# Patient Record
Sex: Female | Born: 1990 | Race: Black or African American | Hispanic: No | Marital: Single | Smoking: Never smoker
Health system: Southern US, Community
[De-identification: ages and names within clinical notes are randomized; demographics above are authoritative.]

## PROBLEM LIST (undated history)

## (undated) DIAGNOSIS — Z789 Other specified health status: Secondary | ICD-10-CM

## (undated) DIAGNOSIS — Z8489 Family history of other specified conditions: Secondary | ICD-10-CM

## (undated) DIAGNOSIS — N883 Incompetence of cervix uteri: Secondary | ICD-10-CM

## (undated) HISTORY — PX: DILATION AND CURETTAGE OF UTERUS: SHX78

## (undated) HISTORY — DX: Family history of other specified conditions: Z84.89

## (undated) HISTORY — DX: Other specified health status: Z78.9

---

## 2015-06-09 HISTORY — PX: CERVICAL CERCLAGE: SHX1329

## 2019-06-17 ENCOUNTER — Other Ambulatory Visit: Payer: Self-pay

## 2019-06-17 DIAGNOSIS — Z20822 Contact with and (suspected) exposure to covid-19: Secondary | ICD-10-CM | POA: Diagnosis not present

## 2019-06-18 LAB — NOVEL CORONAVIRUS, NAA: SARS-CoV-2, NAA: NOT DETECTED

## 2019-07-03 DIAGNOSIS — L680 Hirsutism: Secondary | ICD-10-CM | POA: Diagnosis not present

## 2019-07-03 DIAGNOSIS — Z0000 Encounter for general adult medical examination without abnormal findings: Secondary | ICD-10-CM | POA: Diagnosis not present

## 2019-07-10 DIAGNOSIS — Z01419 Encounter for gynecological examination (general) (routine) without abnormal findings: Secondary | ICD-10-CM | POA: Diagnosis not present

## 2019-10-05 DIAGNOSIS — R109 Unspecified abdominal pain: Secondary | ICD-10-CM | POA: Diagnosis not present

## 2019-12-31 ENCOUNTER — Encounter (HOSPITAL_COMMUNITY): Payer: Self-pay | Admitting: Obstetrics and Gynecology

## 2019-12-31 ENCOUNTER — Emergency Department (HOSPITAL_COMMUNITY)
Admission: EM | Admit: 2019-12-31 | Discharge: 2019-12-31 | Disposition: A | Discharge status: Home / Self Care | Payer: Federal, State, Local not specified - PPO | Attending: Emergency Medicine | Admitting: Emergency Medicine

## 2019-12-31 ENCOUNTER — Other Ambulatory Visit: Payer: Self-pay

## 2019-12-31 DIAGNOSIS — Z3A00 Weeks of gestation of pregnancy not specified: Secondary | ICD-10-CM | POA: Diagnosis not present

## 2019-12-31 DIAGNOSIS — O26891 Other specified pregnancy related conditions, first trimester: Secondary | ICD-10-CM | POA: Insufficient documentation

## 2019-12-31 DIAGNOSIS — Z5321 Procedure and treatment not carried out due to patient leaving prior to being seen by health care provider: Secondary | ICD-10-CM | POA: Diagnosis not present

## 2019-12-31 DIAGNOSIS — R109 Unspecified abdominal pain: Secondary | ICD-10-CM | POA: Insufficient documentation

## 2019-12-31 DIAGNOSIS — O208 Other hemorrhage in early pregnancy: Secondary | ICD-10-CM | POA: Insufficient documentation

## 2019-12-31 HISTORY — DX: Incompetence of cervix uteri: N88.3

## 2019-12-31 LAB — COMPREHENSIVE METABOLIC PANEL
ALT: 15 U/L (ref 0–44)
AST: 29 U/L (ref 15–41)
Albumin: 4.2 g/dL (ref 3.5–5.0)
Alkaline Phosphatase: 59 U/L (ref 38–126)
Anion gap: 11 (ref 5–15)
BUN: 7 mg/dL (ref 6–20)
CO2: 22 mmol/L (ref 22–32)
Calcium: 9.1 mg/dL (ref 8.9–10.3)
Chloride: 102 mmol/L (ref 98–111)
Creatinine, Ser: 0.71 mg/dL (ref 0.44–1.00)
GFR calc Af Amer: >60 mL/min (ref >60)
GFR calc non Af Amer: >60 mL/min (ref >60)
Glucose, Bld: 113 mg/dL — ABNORMAL HIGH (ref 70–99)
Potassium: 3.2 mmol/L — ABNORMAL LOW (ref 3.5–5.1)
Sodium: 135 mmol/L (ref 135–145)
Total Bilirubin: 0.4 mg/dL (ref 0.3–1.2)
Total Protein: 8.2 g/dL — ABNORMAL HIGH (ref 6.5–8.1)

## 2019-12-31 LAB — CBC
HCT: 39.9 % (ref 36.0–46.0)
Hemoglobin: 12.7 g/dL (ref 12.0–15.0)
MCH: 29.3 pg (ref 26.0–34.0)
MCHC: 31.8 g/dL (ref 30.0–36.0)
MCV: 92.1 fL (ref 80.0–100.0)
Platelets: 198 K/uL (ref 150–400)
RBC: 4.33 MIL/uL (ref 3.87–5.11)
RDW: 13.3 % (ref 11.5–15.5)
WBC: 5.6 K/uL (ref 4.0–10.5)
nRBC: 0 % (ref 0.0–0.2)

## 2019-12-31 LAB — HCG, QUANTITATIVE, PREGNANCY: hCG, Beta Chain, Quant, S: 18635 m[IU]/mL — ABNORMAL HIGH

## 2019-12-31 LAB — LIPASE, BLOOD: Lipase: 23 U/L (ref 11–51)

## 2019-12-31 MED ORDER — SODIUM CHLORIDE 0.9% FLUSH: 3.0000 mL | Freq: Once | INTRAVENOUS | Status: DC

## 2019-12-31 NOTE — ED Triage Notes (Signed)
Pt reports to the ER for spotting during pregnancy. Patient reports she had a positive urine preg test at home. Reports cramping and spotting

## 2020-01-03 DIAGNOSIS — Z124 Encounter for screening for malignant neoplasm of cervix: Secondary | ICD-10-CM | POA: Diagnosis not present

## 2020-01-03 DIAGNOSIS — N898 Other specified noninflammatory disorders of vagina: Secondary | ICD-10-CM | POA: Diagnosis not present

## 2020-01-04 DIAGNOSIS — Z3400 Encounter for supervision of normal first pregnancy, unspecified trimester: Secondary | ICD-10-CM | POA: Diagnosis not present

## 2020-01-04 DIAGNOSIS — O209 Hemorrhage in early pregnancy, unspecified: Secondary | ICD-10-CM | POA: Diagnosis not present

## 2020-01-04 DIAGNOSIS — O09291 Supervision of pregnancy with other poor reproductive or obstetric history, first trimester: Secondary | ICD-10-CM | POA: Diagnosis not present

## 2020-01-17 DIAGNOSIS — O210 Mild hyperemesis gravidarum: Secondary | ICD-10-CM | POA: Diagnosis not present

## 2020-01-25 DIAGNOSIS — O09291 Supervision of pregnancy with other poor reproductive or obstetric history, first trimester: Secondary | ICD-10-CM | POA: Diagnosis not present

## 2020-01-25 DIAGNOSIS — O23599 Infection of other part of genital tract in pregnancy, unspecified trimester: Secondary | ICD-10-CM | POA: Diagnosis not present

## 2020-01-25 DIAGNOSIS — Z9889 Other specified postprocedural states: Secondary | ICD-10-CM | POA: Diagnosis not present

## 2020-01-25 DIAGNOSIS — O09891 Supervision of other high risk pregnancies, first trimester: Secondary | ICD-10-CM | POA: Diagnosis not present

## 2020-02-02 ENCOUNTER — Encounter: Payer: Self-pay | Admitting: *Deleted

## 2020-02-06 ENCOUNTER — Ambulatory Visit: Payer: Federal, State, Local not specified - PPO

## 2020-02-06 ENCOUNTER — Other Ambulatory Visit: Payer: Self-pay

## 2020-02-06 ENCOUNTER — Ambulatory Visit: Payer: Federal, State, Local not specified - PPO | Admitting: *Deleted

## 2020-02-06 ENCOUNTER — Other Ambulatory Visit: Payer: Self-pay | Admitting: *Deleted

## 2020-02-06 ENCOUNTER — Encounter: Payer: Self-pay | Admitting: *Deleted

## 2020-02-06 ENCOUNTER — Ambulatory Visit
Payer: Federal, State, Local not specified - PPO | Attending: Obstetrics and Gynecology | Admitting: Obstetrics and Gynecology

## 2020-02-06 VITALS — BP 122/86 | HR 89

## 2020-02-06 DIAGNOSIS — Z3A11 11 weeks gestation of pregnancy: Secondary | ICD-10-CM | POA: Insufficient documentation

## 2020-02-06 DIAGNOSIS — O0990 Supervision of high risk pregnancy, unspecified, unspecified trimester: Secondary | ICD-10-CM

## 2020-02-06 DIAGNOSIS — Z3401 Encounter for supervision of normal first pregnancy, first trimester: Secondary | ICD-10-CM

## 2020-02-06 DIAGNOSIS — O3431 Maternal care for cervical incompetence, first trimester: Secondary | ICD-10-CM | POA: Insufficient documentation

## 2020-02-06 DIAGNOSIS — Z8759 Personal history of other complications of pregnancy, childbirth and the puerperium: Secondary | ICD-10-CM | POA: Diagnosis not present

## 2020-02-06 DIAGNOSIS — O09291 Supervision of pregnancy with other poor reproductive or obstetric history, first trimester: Secondary | ICD-10-CM

## 2020-02-06 DIAGNOSIS — N883 Incompetence of cervix uteri: Secondary | ICD-10-CM

## 2020-02-06 DIAGNOSIS — O0991 Supervision of high risk pregnancy, unspecified, first trimester: Secondary | ICD-10-CM | POA: Diagnosis not present

## 2020-02-06 NOTE — Progress Notes (Signed)
MFM Consult Note  Patient name: April Burke DOB: [birth year]  April Burke is a gravida 4 para 0 who is currently at 11 weeks and 1 day with an [facility] of August 26, 2020.  She was seen in consultation today due to a prior poor obstetrical history with probable cervical incompetence with two midtrimester losses.  The patient's pregnancy history is as follows:  2014- termination of pregnancy  2016- loss at 16 to 17 weeks due to advanced cervical dilatation  2017-loss at 20 weeks due to preterm labor/cervical incompetence.  The patient reports that a cervical cerclage was placed at around 16 weeks when her cervix was found to be 1 cm dilated.  She reports that she was also treated with vaginal progesterone.  However, at around 20 weeks, the membranes prolapsed beyond the cerclage and she subsequently delivered.  She denies any prior cervical surgeries.  These pregnancies all occurred at a hospital in [place], [place].  She denies any significant past medical history.  Her surgical history includes a D&C for termination of pregnancy, cerclage placement, and removal of stone in her salivary gland.  The patient reports that she had an ultrasound performed in your office about a month ago showing a viable singleton intrauterine pregnancy with a crown-rump length that measured consistent with her dates.  She has not had any screening tests for fetal aneuploidy drawn in her current pregnancy.  The patient was advised that although it is difficult to ascertain if she truly has cervical incompetence, based on her history of two prior losses possibly due to early cervical dilatation, a history indicated cervical cerclage is recommended in her current pregnancy.  The cerclage should be placed at between 13 to 14 weeks, prior to the onset of cervical shortening, so that a cerclage encompassing a good segment of the cervix can be placed.  The patient should probably have another ultrasound performed in your  office prior to the placement of the cerclage to verify that a viable intrauterine pregnancy is present.  She will have a cell free DNA test through the lab [facility] drawn in our office later today to screen for fetal aneuploidy.  The results from this test should be available sometime next week.  Our genetic counselor will notify the patient regarding the results.  Due to her history of multiple preterm births, I would also recommend that she be started on weekly injections of 17 alpha hydroxyprogesterone caproate (17-P, Makena) starting at around 16 weeks and continued weekly until 36 weeks.  The patient was advised that the weekly progesterone injections have been shown to decrease the risk of a subsequent preterm birth in women with a history of prior preterm births.  An ultrasound for assessment of her cervical length was scheduled in our office at around 16 weeks.  We will also schedule a detailed fetal anatomy scan for her at around 18 to 19 weeks.  She should then continue to be followed with serial growth ultrasounds throughout her pregnancy.  The patient and her partner were advised that hopefully with the prophylactic cerclage and the weekly progesterone injections, we will help her achieve a successful pregnancy outcome. They understand that despite all medical intervention, some women with cervical incompetence may still experience another loss.  They were reassured that I have managed the pregnancies of many women with a similar pregnancy history who had successful pregnancy outcomes with a prophylactic cerclage and weekly progesterone injections.  They were advised that should she develop preterm labor symptoms  once she reaches viability (23 weeks or greater), that preterm labor can be managed to help her achieve a successful pregnancy outcome.  At the end of the consultation, the patient and her partner stated that all their questions had been answered to their complete satisfaction.  We look  forward to following her with you throughout her pregnancy.    Thank you for referring this very nice patient for Maternal-Fetal Medicine consultation.  Recommendations:  Cell free DNA test drawn today  Prophylactic cerclage to be placed at 13 to 14 weeks   Start weekly 17-P/Makena injections at 16 weeks and continued weekly until 36 weeks  Cervical length measurement at 16 weeks and fetal anatomy scan scheduled in our office

## 2020-02-08 ENCOUNTER — Other Ambulatory Visit: Payer: Self-pay | Admitting: Obstetrics and Gynecology

## 2020-02-13 ENCOUNTER — Telehealth: Payer: Self-pay | Admitting: Genetic Counselor

## 2020-02-13 NOTE — Telephone Encounter (Addendum)
Received a call back from Ms. Pree. I informed her that unfortunately, her NIPS sample failed due to a laboratory processing issue. Per [facility], this failure was not due to a problem with specimen collection or a specimen integrity issue. I reassured Ms. Youngers that there is nothing she did to cause the sample to fail.   I informed her that a redraw is possible if she is still interested in pursuing NIPS. She does not think that she wants a redraw at this time. However, she requested more time to decide if she wants to try again or not. She informed me that she has her cerclage appointment next week on 9/13. We reviewed that if she had a sample redrawn today, it is possible that results may be back before then; however, this cannot be guaranteed, as NIPS results usually take ~1 week to be returned. She was also informed that NIPS still remains an option after she has her cerclage placed.  I asked Ms. Potteiger to contact me once she has decided if she would like to have a redraw for NIPS. If she does, I can give her instructions for sample collection. If she does not, I will contact [facility] and have them cancel the order so that she does not get a bill. Ms. Lafuente was agreeable with this plan.  Buelah Manis, MS, [facility] Genetic Counselor

## 2020-02-13 NOTE — Telephone Encounter (Signed)
LVM for April Burke informing her that I had an update regarding her testing, as her [facility] noninvasive prenatal screening (NIPS) failed. Requested a call back to discuss this update in detail and will review redraw options with her at that time.  Buelah Manis, MS, [facility] Genetic Counselor

## 2020-02-15 DIAGNOSIS — O09299 Supervision of pregnancy with other poor reproductive or obstetric history, unspecified trimester: Secondary | ICD-10-CM | POA: Diagnosis not present

## 2020-02-19 NOTE — H&P (Addendum)
GYN Admission H&P  April Burke is an 29 y.o. G4P0120 at [redacted]w[redacted]d by 6 week [place] who presents for history indicated cerclage placement.  Her pregnancy is complicated by history of cervical insufficiency, obesity (BMI 33), and rubella non-immune.  Her obstetric history is as documented below.  After consultation with Maternal Fetal Medicine (Dr. Annamaria Boots), it was recommended that based on her history that she have history indicated cerclage placement between 13-[redacted] weeks gestation.     Obstetric History: G1:  2010, early EAB (D&C), no complications G2:  6213:  16-17 week SAB, occurred in [place] G3:  2017, 20 week PTD (failed rescue cerclage that was placed at 16 weeks), occurred in [place]:  Current    Past Medical History:  Diagnosis Date  . Incompetent cervix   . Medical history non-contributory     Past Surgical History:  Procedure Laterality Date  . CERVICAL CERCLAGE  2017   Failed rescue cerclage  . DILATION AND CURETTAGE OF UTERUS      Family History  Problem Relation Age of Onset  . Hypertension Father     Social History:  reports that she has never smoked. She has never used smokeless tobacco. She reports previous alcohol use. She reports previous drug use. Drug: Marijuana.  Allergies: No Known Allergies  Medications:  PNV  Review of Systems  Constitutional: Negative for chills, fever, malaise/fatigue and weight loss.  Eyes: Negative for blurred vision.  Respiratory: Negative for cough.   Cardiovascular: Negative for chest pain, palpitations and leg swelling.  Gastrointestinal: Negative for abdominal pain, blood in stool, constipation and heartburn.  Genitourinary: Negative for dysuria, flank pain, frequency, hematuria and urgency.  Musculoskeletal: Negative.  Negative for myalgias and neck pain.  Skin: Negative for itching and rash.  Neurological: Negative for dizziness and headaches.  Psychiatric/Behavioral: Negative.     Physical Exam: Last menstrual period 11/13/2019.   Gen:  NAD, pleasant and cooperative Cardio:  RRR Lungs:  CTAB Abd:  Soft, gravid, non-tender throughout Ext:  No bilateral LE edema Pelvic:  Cervix visually closed, no blood in vaginal vault  Wet prep (02/15/20):  Few clue cells, no yeast, no trichomonas GC/CT (02/15/20):  Negative  No results found for this or any previous visit (from the past 48 hour(s)).  No results found.  Assessment/Plan: April Burke is a 29 y.o. G4P0120 at [redacted]w[redacted]d by 6 week [place] who presents for history indicated cerclage placement.  Her pregnancy is complicated by history of cervical insufficiency, obesity (BMI 33), and rubella non-immune.  - Admit to L&D OR for outpatient procedure - Labs:  T&S - IVF:  Per anesthesia - Antibiotics:  None indicated - DVT prophylaxis:  SCDs - Fetal heart tones before and after procedure - Genetic screening:  Previous lab error with cfDNA, will obtain after procedure - Treatment sent for BV prior to procedure - Post-op visit 2 weeks after procedure - Anticipate DC home same day  Consents (Cerclage and blood products):  Patient counseled on the risks, benefits, alternatives to cerclage.  Risks include, but at not limited to, bleeding, requiring blood transfusion, infection, damage to organs and tissues, risk of preterm labor or premature rupture of membranes with subsequent previable, periviable, or preterm delivery and infant death, risk of failure of procedure to achieve prolongation of pregnancy, deep vein thrombosis and/or pulmonary embolism, complications the course of which cannot be predicted or prevented, and death.  She understands that there is a risk of bleeding which includes the risk of requiring a blood  transfusion which holds a 1:2 million risk of HIV, a 1:2 million risk of Hepatitis C, and a 1:200,000 risk of Hepatitis B as well as the risk of allergic type reactions called transfusion reactions.  Drema Dallas 02/19/2020

## 2020-02-20 ENCOUNTER — Encounter (HOSPITAL_BASED_OUTPATIENT_CLINIC_OR_DEPARTMENT_OTHER): Payer: Self-pay | Admitting: Obstetrics and Gynecology

## 2020-02-20 ENCOUNTER — Other Ambulatory Visit: Payer: Self-pay

## 2020-02-20 DIAGNOSIS — O09299 Supervision of pregnancy with other poor reproductive or obstetric history, unspecified trimester: Secondary | ICD-10-CM | POA: Diagnosis not present

## 2020-02-20 DIAGNOSIS — Z3A13 13 weeks gestation of pregnancy: Secondary | ICD-10-CM | POA: Diagnosis not present

## 2020-02-21 NOTE — Progress Notes (Signed)
MD order for Preoperative and Postoperative Fetal Heart Tones.Called OB Rapid Response Nurse and spoke with Larene Beach. Provided patient information, arrival and surgery time information for arrangement of OB RR nurse to come to [facility] for FHT assessment. Provide phone number for [facility] to contact if questions.

## 2020-02-22 ENCOUNTER — Telehealth (HOSPITAL_COMMUNITY): Payer: Self-pay | Admitting: *Deleted

## 2020-02-22 ENCOUNTER — Other Ambulatory Visit (HOSPITAL_COMMUNITY)
Admission: RE | Admit: 2020-02-22 | Discharge: 2020-02-22 | Disposition: A | Discharge status: Home / Self Care | Payer: Federal, State, Local not specified - PPO | Source: Ambulatory Visit | Attending: Obstetrics and Gynecology | Admitting: Obstetrics and Gynecology

## 2020-02-22 ENCOUNTER — Encounter (HOSPITAL_COMMUNITY): Payer: Self-pay

## 2020-02-22 DIAGNOSIS — Z20822 Contact with and (suspected) exposure to covid-19: Secondary | ICD-10-CM | POA: Diagnosis not present

## 2020-02-22 DIAGNOSIS — Z01812 Encounter for preprocedural laboratory examination: Secondary | ICD-10-CM | POA: Diagnosis not present

## 2020-02-22 LAB — SARS CORONAVIRUS 2 (TAT 6-24 HRS): SARS Coronavirus 2: NEGATIVE

## 2020-02-22 NOTE — Telephone Encounter (Signed)
Pt aware of cerclage procedure location change to [facility] and [place].  Arrival of 0530  NPO.  No medications to take.  Going to get her covid test today as planned by [employer].   Questions answered.

## 2020-02-25 NOTE — Anesthesia Preprocedure Evaluation (Addendum)
Anesthesia Evaluation  Patient identified by MRN, date of birth, ID band Patient awake    Reviewed: Allergy & Precautions, NPO status , Patient's Chart, lab work & pertinent test results  Airway Mallampati: II  TM Distance: >3 FB Neck ROM: Full    Dental  (+) Teeth Intact   Pulmonary neg pulmonary ROS,    Pulmonary exam normal breath sounds clear to auscultation       Cardiovascular Exercise Tolerance: Good negative cardio ROS Normal cardiovascular exam Rhythm:Regular Rate:Normal     Neuro/Psych negative neurological ROS  negative psych ROS   GI/Hepatic negative GI ROS, Neg liver ROS,   Endo/Other  negative endocrine ROSObesity   Renal/GU negative Renal ROS     Musculoskeletal negative musculoskeletal ROS (+)   Abdominal   Peds  Hematology  (+) Blood dyscrasia, anemia , PLT 171K   Anesthesia Other Findings   Reproductive/Obstetrics (+) Pregnancy (14 wks) history of cervical incompetence in pregnancy                           Anesthesia Physical Anesthesia Plan  ASA: II  Anesthesia Plan: Spinal   Post-op Pain Management:    Induction:   PONV Risk Score and Plan: 2 and Treatment may vary due to age or medical condition  Airway Management Planned: Natural Airway  Additional Equipment:   Intra-op Plan:   Post-operative Plan:   Informed Consent: I have reviewed the patients History and Physical, chart, labs and discussed the procedure including the risks, benefits and alternatives for the proposed anesthesia with the patient or authorized representative who has indicated his/her understanding and acceptance.       Plan Discussed with: CRNA  Anesthesia Plan Comments:        Anesthesia Quick Evaluation

## 2020-02-26 ENCOUNTER — Observation Stay (HOSPITAL_COMMUNITY)
Admission: RE | Admit: 2020-02-26 | Discharge: 2020-02-26 | Disposition: A | Discharge status: Home / Self Care | Payer: Federal, State, Local not specified - PPO | Attending: Obstetrics and Gynecology | Admitting: Obstetrics and Gynecology

## 2020-02-26 ENCOUNTER — Encounter (HOSPITAL_COMMUNITY): Payer: Self-pay | Admitting: Obstetrics and Gynecology

## 2020-02-26 ENCOUNTER — Other Ambulatory Visit: Payer: Self-pay

## 2020-02-26 ENCOUNTER — Inpatient Hospital Stay (HOSPITAL_COMMUNITY): Payer: Federal, State, Local not specified - PPO | Admitting: Anesthesiology

## 2020-02-26 ENCOUNTER — Encounter (HOSPITAL_COMMUNITY)
Admission: RE | Disposition: A | Discharge status: Home / Self Care | Payer: Self-pay | Source: Home / Self Care | Attending: Obstetrics and Gynecology

## 2020-02-26 DIAGNOSIS — O3431 Maternal care for cervical incompetence, first trimester: Secondary | ICD-10-CM | POA: Diagnosis not present

## 2020-02-26 DIAGNOSIS — Z3A14 14 weeks gestation of pregnancy: Secondary | ICD-10-CM | POA: Diagnosis not present

## 2020-02-26 DIAGNOSIS — O3432 Maternal care for cervical incompetence, second trimester: Secondary | ICD-10-CM

## 2020-02-26 DIAGNOSIS — O99011 Anemia complicating pregnancy, first trimester: Secondary | ICD-10-CM | POA: Diagnosis not present

## 2020-02-26 DIAGNOSIS — N883 Incompetence of cervix uteri: Secondary | ICD-10-CM | POA: Diagnosis present

## 2020-02-26 DIAGNOSIS — D649 Anemia, unspecified: Secondary | ICD-10-CM | POA: Diagnosis not present

## 2020-02-26 HISTORY — PX: CERVICAL CERCLAGE: SHX1329

## 2020-02-26 LAB — CBC
HCT: 34.3 % — ABNORMAL LOW (ref 36.0–46.0)
Hemoglobin: 11.4 g/dL — ABNORMAL LOW (ref 12.0–15.0)
MCH: 29.3 pg (ref 26.0–34.0)
MCHC: 33.2 g/dL (ref 30.0–36.0)
MCV: 88.2 fL (ref 80.0–100.0)
Platelets: 171 10*3/uL (ref 150–400)
RBC: 3.89 MIL/uL (ref 3.87–5.11)
RDW: 13.3 % (ref 11.5–15.5)
WBC: 6 10*3/uL (ref 4.0–10.5)
nRBC: 0 % (ref 0.0–0.2)

## 2020-02-26 LAB — TYPE AND SCREEN
ABO/RH(D): B POS
Antibody Screen: NEGATIVE

## 2020-02-26 SURGERY — CERCLAGE, CERVIX, VAGINAL APPROACH
Anesthesia: Spinal

## 2020-02-26 MED ORDER — CHLOROPROCAINE HCL 1 % IJ SOLN
INTRAMUSCULAR | Status: DC | PRN
  Administered 2020-02-26: 50 mg

## 2020-02-26 MED ORDER — PHENYLEPHRINE HCL (PRESSORS) 10 MG/ML IV SOLN
INTRAVENOUS | Status: DC | PRN
  Administered 2020-02-26: 80 ug via INTRAVENOUS

## 2020-02-26 MED ORDER — POVIDONE-IODINE 10 % EX SWAB
2.0000 "application " | Freq: Once | CUTANEOUS | Status: AC
  Administered 2020-02-26: 2 via TOPICAL

## 2020-02-26 MED ORDER — ONDANSETRON HCL 4 MG/2ML IJ SOLN
INTRAMUSCULAR | Status: DC | PRN
  Administered 2020-02-26: 4 mg via INTRAVENOUS

## 2020-02-26 MED ORDER — LACTATED RINGERS IV SOLN: INTRAVENOUS | Status: DC

## 2020-02-26 MED ORDER — ACETAMINOPHEN 500 MG PO TABS
1000.0000 mg | ORAL_TABLET | Freq: Once | ORAL | Status: AC
  Administered 2020-02-26: 1000 mg via ORAL

## 2020-02-26 MED ORDER — ONDANSETRON HCL 4 MG/2ML IJ SOLN
INTRAMUSCULAR | Status: AC
  Filled 2020-02-26: qty 2

## 2020-02-26 MED ORDER — SOD CITRATE-CITRIC ACID 500-334 MG/5ML PO SOLN
ORAL | Status: AC
  Filled 2020-02-26: qty 30

## 2020-02-26 MED ORDER — LACTATED RINGERS IV SOLN: INTRAVENOUS | Status: DC | PRN

## 2020-02-26 MED ORDER — FENTANYL CITRATE (PF) 100 MCG/2ML IJ SOLN: 25.0000 ug | INTRAMUSCULAR | Status: DC | PRN

## 2020-02-26 MED ORDER — PHENYLEPHRINE 40 MCG/ML (10ML) SYRINGE FOR IV PUSH (FOR BLOOD PRESSURE SUPPORT)
PREFILLED_SYRINGE | INTRAVENOUS | Status: AC
  Filled 2020-02-26: qty 10

## 2020-02-26 MED ORDER — PROMETHAZINE HCL 25 MG/ML IJ SOLN: 6.2500 mg | INTRAMUSCULAR | Status: DC | PRN

## 2020-02-26 MED ORDER — SOD CITRATE-CITRIC ACID 500-334 MG/5ML PO SOLN
30.0000 mL | Freq: Once | ORAL | Status: AC
  Administered 2020-02-26: 30 mL via ORAL

## 2020-02-26 MED ORDER — CHLOROPROCAINE HCL 50 MG/5ML IT SOLN
INTRATHECAL | Status: AC
  Filled 2020-02-26: qty 5

## 2020-02-26 MED ORDER — ACETAMINOPHEN 500 MG PO TABS
ORAL_TABLET | ORAL | Status: AC
  Filled 2020-02-26: qty 2

## 2020-02-26 SURGICAL SUPPLY — 11 items
CLOTH BEACON ORANGE TIMEOUT ST (SAFETY) ×2 IMPLANT
GLOVE ECLIPSE 6.5 STRL STRAW (GLOVE) ×4 IMPLANT
GOWN STRL REUS W/ TWL LRG LVL3 (GOWN DISPOSABLE) IMPLANT
GOWN STRL REUS W/TWL LRG LVL3 (GOWN DISPOSABLE) ×4
NDL HYPO 25X5/8 SAFETYGLIDE (NEEDLE) IMPLANT
NEEDLE HYPO 25X5/8 SAFETYGLIDE (NEEDLE) ×3 IMPLANT
NS IRRIG 1000ML POUR BTL (IV SOLUTION) ×2 IMPLANT
PAD OB MATERNITY 4.3X12.25 (PERSONAL CARE ITEMS) ×2 IMPLANT
SUT PROLENE 1 CT 1 30 (SUTURE) ×2 IMPLANT
TOWEL OR 17X24 6PK STRL BLUE (TOWEL DISPOSABLE) ×2 IMPLANT
TRAY FOLEY SLVR 16FR LF STAT (SET/KITS/TRAYS/PACK) ×2 IMPLANT

## 2020-02-26 NOTE — Discharge Instructions (Signed)
Cervical Cerclage, Care After This sheet gives you information about how to care for yourself after your procedure. Your health care provider may also give you more specific instructions. If you have problems or questions, contact your health care provider. What can I expect after the procedure? After your procedure, it is common to have:  Cramping in your abdomen.  Mucus discharge from your vagina. This may last for several days.  Painful urination (dysuria).  Spotting, or small drops of blood coming from your vagina. Follow these instructions at home:  Medicines  Take over-the-counter and prescription medicines only as told by your health care provider.  Ask your health care provider if the medicine prescribed to you requires you to avoid driving or using heavy machinery. General instructions  If you are told to go on bed rest, follow instructions from your health care provider. You may need to be on bed rest for up to 3 days.  Keep track of your vaginal discharge and watch for any changes. If you notice changes, tell your health care provider.  Avoid physical activities and exercise until your health care provider approves. Ask your health care provider what activities are safe for you.  Do not douche or have sex until your health care provider says it is okay to do so.  Keep all follow-up visits, including prenatal visits, as told by your health care provider. This is important. ? Prenatal visits are all the care that you receive before the birth of your baby. ? You may also need an ultrasound.  You may be asked to have weekly visits to have your cervix checked. Contact a health care provider if you:  Have abnormal discharge from your vagina, such as clots.  Have a bad-smelling discharge from your vagina.  Develop a rash on your skin. This may look like redness and swelling.  Become light-headed or feel like you are going to faint.  Have abdominal pain that does not  get better with medicine.  Have nausea or vomiting that does not go away. Get help right away if you:  Have vaginal bleeding that is heavier or more frequent than spotting.  Are leaking fluid or your water breaks.  Have a fever or chills.  Faint.  Have uterine contractions. These may feel like: ? A back ache. ? Lower abdominal pain. ? Mild cramps, similar to menstrual cramps. ? Tightening or pressure in your abdomen.  Think that your baby is not moving as much as usual, or you cannot feel your baby move.  Have chest pain.  Have shortness of breath. Summary  After the procedure, it is common to have cramping, vaginal discharge, painful urination, and small drops of blood coming from your vagina.  If you are told to go on bed rest, follow instructions from your health care provider. You may need to be on bed rest for up to 3 days.  Keep track of your vaginal discharge and watch for any changes. If you notice changes, tell your health care provider.  Contact a health care provider if you have abnormal vaginal discharge, become light-headed, or have pain that cannot be controlled with medicines.  Get help right away if you have heavy vaginal bleeding, your water breaks, or you have uterine contractions. Also, get help right away if your baby is not moving as much as usual, or you have chest pain or shortness of breath. This information is not intended to replace advice given to you by your health care provider.  Make sure you discuss any questions you have with your health care provider. Document Revised: 03/21/2019 Document Reviewed: 01/18/2019 Elsevier Patient Education  2020 [employer].

## 2020-02-26 NOTE — H&P (Signed)
See updated H&P for no interval changes.  Drema Dallas, DO

## 2020-02-26 NOTE — Transfer of Care (Signed)
Immediate Anesthesia Transfer of Care Note  Patient: April Burke  Procedure(s) Performed: CERCLAGE CERVICAL (N/A )  Patient Location: PACU  Anesthesia Type:Spinal  Level of Consciousness: awake, alert  and oriented  Airway & Oxygen Therapy: Patient Spontanous Breathing  Post-op Assessment: Report given to RN and Post -op Vital signs reviewed and stable  Post vital signs: Reviewed and stable  Last Vitals:  Vitals Value Taken Time  BP 115/52 02/26/20 0808  Temp    Pulse 85 02/26/20 0811  Resp 18 02/26/20 0811  SpO2 99 % 02/26/20 0811  Vitals shown include unvalidated device data.  Last Pain:  Vitals:   02/26/20 0620  TempSrc: Oral         Complications: No complications documented.

## 2020-02-26 NOTE — Op Note (Signed)
Preop Diagnosis:  History of cervical insufficiency  Postop Diagnosis:  History of cervical insufficiency  Procedure: History indicated McDonald Cerclage  Anesthesia:  Regional (spinal)  Attending:  Dr. Drema Dallas  Assistant:  Dr. Christophe Louis  Findings:  Normal-appearing cervix, no cervical dilation present, white discharge in vaginal vault.  Pathology:  None  Fluids:  1000cc crystalloid  UOP:  100cc   EBL:  3cc  Complications:  None  Indications:  29 year old G37P0120 with history of 16-17 week SAB and 20 week PTD after failed rescue cerclage that was placed at 16 weeks.  Maternal Fetal Medicine recommended history indicated cerclage placement between 13-14 weeks.  Procedure:Then patient was taken to the operating room after the risks, benefits and alternatives discussed with the patient and consent signed and witnessed.  Fetal heart tones were 159 prior to procedure.  The patient was given a spinal per anesthesia and placed in the dorsal lithotomy position.  The patient was prepped and draped in the usual sterile fashion.  A cervical cerclage stitch was placed using 1-0 prolene in a pursestring fashion with knot at 12 o'clock.  Hemostasis was noted.  Sponge, lap and needle count was correct and the patient was transferred to the recovery room in good condition.   Disposition:  PACU  Drema Dallas, DO

## 2020-02-26 NOTE — Discharge Summary (Addendum)
     GYN Discharge Summary  Date of Service 02/26/2020     Patient Name: April Burke DOB: [birth year] MRN: [id number]  Date of admission: 02/26/2020  Date of discharge: 02/26/2020  Admitting diagnosis: Cervical incompetence [N88.3] Intrauterine pregnancy: [redacted]w[redacted]d     Secondary diagnosis:  Active Problems:   Cervical incompetence  Procedure:  McDonald Cerclage Additional problems: Obesity (BMI 33) and Rubella non-immune   Discharge diagnosis: History of cervical insufficiency                                              Complications: None  Hospital Course:  Patient was admitted on 02/26/20 for a history indicated McDonald Cerclage placement.  Her post-operative course was unremarkable.  She was discharged home on 02/26/20 in stable condition.  A work-note was provided for the dates 9/18-9/23 to include pre-operative COVID testing/quarantine and post-operative recovery.  Physical exam  Vitals:   02/20/20 1611 02/26/20 0620  BP:  120/80  Resp:  17  Temp:  97.9 F (36.6 C)  TempSrc:  Oral  SpO2:  99%  Weight: 98 kg 98 kg  Height: 5\' 7"  (1.702 m) 5\' 7"  (1.702 m)   Labs: Lab Results  Component Value Date   WBC 6.0 02/26/2020   HGB 11.4 (L) 02/26/2020   HCT 34.3 (L) 02/26/2020   MCV 88.2 02/26/2020   PLT 171 02/26/2020   CMP Latest Ref Rng & Units 12/31/2019  Glucose 70 - 99 mg/dL 113(H)  BUN 6 - 20 mg/dL 7  Creatinine 0.44 - 1.00 mg/dL 0.71  Sodium 135 - 145 mmol/L 135  Potassium 3.5 - 5.1 mmol/L 3.2(L)  Chloride 98 - 111 mmol/L 102  CO2 22 - 32 mmol/L 22  Calcium 8.9 - 10.3 mg/dL 9.1  Total Protein 6.5 - 8.1 g/dL 8.2(H)  Total Bilirubin 0.3 - 1.2 mg/dL 0.4  Alkaline Phos 38 - 126 U/L 59  AST 15 - 41 U/L 29  ALT 0 - 44 U/L 15     After visit meds:  Allergies as of 02/26/2020   No Known Allergies     Medication List    TAKE these medications   aspirin EC 81 MG tablet Take 81 mg by mouth daily. Swallow whole.   metroNIDAZOLE 250 MG tablet Commonly known as:  FLAGYL Take 250 mg by mouth 2 (two) times daily.   PRENATAL GUMMIES/DHA & FA PO Take by mouth.        Discharge home in stable condition Discharge instruction: per After Visit Summary  Activity: Advance as tolerated. Pelvic rest for 6 weeks.  Diet: routine diet Post-operative Appointment:2 weeks (03/11/2020)  Future Appointments: Future Appointments  Date Time Provider [place]  03/12/2020  7:30 AM [facility] NURSE [facility]  03/12/2020  7:45 AM [facility] US4 [facility]  03/26/2020  8:45 AM [facility] NURSE [facility]  03/26/2020  9:00 AM [facility] US1 [facility] [place]   Follow up Visit:  Follow-up Information    Drema Dallas, DO Follow up in 2 week(s).   Specialty: Obstetrics and Gynecology Contact information: [address] [place] [address] [phone]                02/26/2020 Drema Dallas, DO

## 2020-02-26 NOTE — Anesthesia Procedure Notes (Signed)
Spinal  Patient location during procedure: OR Start time: 02/26/2020 7:24 AM End time: 02/26/2020 7:27 AM Staffing Performed: anesthesiologist  Anesthesiologist: Catalina Gravel, MD Preanesthetic Checklist Completed: patient identified, IV checked, risks and benefits discussed, surgical consent, monitors and equipment checked, pre-op evaluation and timeout performed Spinal Block Patient position: sitting Prep: DuraPrep and site prepped and draped Patient monitoring: continuous pulse ox and blood pressure Approach: midline Location: L3-4 Injection technique: single-shot Needle Needle type: Pencan  Needle gauge: 24 G Assessment Sensory level: T8 Additional Notes Functioning IV was confirmed and monitors were applied. Sterile prep and drape, including hand hygiene, mask and sterile gloves were used. The patient was positioned and the spine was prepped. The skin was anesthetized with lidocaine.  Free flow of clear CSF was obtained prior to injecting local anesthetic into the CSF.  The spinal needle aspirated freely following injection.  The needle was carefully withdrawn.  The patient tolerated the procedure well. Consent was obtained prior to procedure with all questions answered and concerns addressed. Risks including but not limited to bleeding, infection, nerve damage, paralysis, failed block, inadequate analgesia, allergic reaction, high spinal, itching and headache were discussed and the patient wished to proceed.   Hoy Morn, MD

## 2020-02-26 NOTE — Anesthesia Postprocedure Evaluation (Signed)
Anesthesia Post Note  Patient: Scientist, product/process development  Procedure(s) Performed: CERCLAGE CERVICAL (N/A )     Patient location during evaluation: PACU Anesthesia Type: Spinal Level of consciousness: oriented, awake and alert and awake Pain management: pain level controlled Vital Signs Assessment: post-procedure vital signs reviewed and stable Respiratory status: spontaneous breathing, respiratory function stable and patient connected to nasal cannula oxygen Cardiovascular status: blood pressure returned to baseline and stable Postop Assessment: no headache, no backache, no apparent nausea or vomiting, patient able to bend at knees and spinal receding Anesthetic complications: no   No complications documented.  Last Vitals:  Vitals:   02/26/20 0902 02/26/20 0915  BP: 105/69 112/69  Pulse: 78 71  Resp: 16 17  Temp:    SpO2: 100% 100%    Last Pain:  Vitals:   02/26/20 0815  TempSrc: Oral   Pain Goal:    LLE Motor Response: No movement due to regional block (02/26/20 0900)   RLE Motor Response: Purposeful movement (02/26/20 0900) RLE Sensation: Tingling (02/26/20 0900)     Epidural/Spinal Function Cutaneous sensation: Tingles (02/26/20 0900), Patient able to flex knees: No (02/26/20 0900), Patient able to lift hips off bed: No (02/26/20 0900), Back pain beyond tenderness at insertion site: No (02/26/20 0900), Progressively worsening motor and/or sensory loss: No (02/26/20 0900), Bowel and/or bladder incontinence post epidural: No (02/26/20 0900)  Catalina Gravel

## 2020-02-29 ENCOUNTER — Encounter (HOSPITAL_COMMUNITY): Payer: Self-pay | Admitting: Obstetrics and Gynecology

## 2020-03-11 DIAGNOSIS — Z3482 Encounter for supervision of other normal pregnancy, second trimester: Secondary | ICD-10-CM | POA: Diagnosis not present

## 2020-03-12 ENCOUNTER — Ambulatory Visit: Payer: Federal, State, Local not specified - PPO

## 2020-03-15 ENCOUNTER — Ambulatory Visit: Payer: Federal, State, Local not specified - PPO | Admitting: *Deleted

## 2020-03-15 ENCOUNTER — Other Ambulatory Visit: Payer: Self-pay | Admitting: *Deleted

## 2020-03-15 ENCOUNTER — Ambulatory Visit: Payer: Federal, State, Local not specified - PPO | Attending: Obstetrics

## 2020-03-15 ENCOUNTER — Other Ambulatory Visit: Payer: Self-pay

## 2020-03-15 VITALS — BP 121/80 | HR 77

## 2020-03-15 DIAGNOSIS — O3430 Maternal care for cervical incompetence, unspecified trimester: Secondary | ICD-10-CM

## 2020-03-15 DIAGNOSIS — O09292 Supervision of pregnancy with other poor reproductive or obstetric history, second trimester: Secondary | ICD-10-CM | POA: Diagnosis not present

## 2020-03-15 DIAGNOSIS — O321XX0 Maternal care for breech presentation, not applicable or unspecified: Secondary | ICD-10-CM

## 2020-03-15 DIAGNOSIS — N883 Incompetence of cervix uteri: Secondary | ICD-10-CM

## 2020-03-15 DIAGNOSIS — Z3A16 16 weeks gestation of pregnancy: Secondary | ICD-10-CM | POA: Diagnosis not present

## 2020-03-15 DIAGNOSIS — Z3686 Encounter for antenatal screening for cervical length: Secondary | ICD-10-CM

## 2020-03-15 DIAGNOSIS — O3432 Maternal care for cervical incompetence, second trimester: Secondary | ICD-10-CM

## 2020-03-19 ENCOUNTER — Other Ambulatory Visit: Payer: Self-pay | Admitting: Obstetrics and Gynecology

## 2020-03-19 DIAGNOSIS — O3430 Maternal care for cervical incompetence, unspecified trimester: Secondary | ICD-10-CM

## 2020-03-20 ENCOUNTER — Ambulatory Visit: Payer: Federal, State, Local not specified - PPO

## 2020-03-20 ENCOUNTER — Other Ambulatory Visit: Payer: Self-pay

## 2020-03-20 ENCOUNTER — Ambulatory Visit: Payer: Federal, State, Local not specified - PPO | Attending: Obstetrics and Gynecology

## 2020-03-20 ENCOUNTER — Other Ambulatory Visit: Payer: Self-pay | Admitting: Obstetrics and Gynecology

## 2020-03-20 ENCOUNTER — Other Ambulatory Visit: Payer: Self-pay | Admitting: *Deleted

## 2020-03-20 DIAGNOSIS — Z3686 Encounter for antenatal screening for cervical length: Secondary | ICD-10-CM

## 2020-03-20 DIAGNOSIS — O09292 Supervision of pregnancy with other poor reproductive or obstetric history, second trimester: Secondary | ICD-10-CM | POA: Diagnosis not present

## 2020-03-20 DIAGNOSIS — O3432 Maternal care for cervical incompetence, second trimester: Secondary | ICD-10-CM

## 2020-03-20 DIAGNOSIS — O3430 Maternal care for cervical incompetence, unspecified trimester: Secondary | ICD-10-CM

## 2020-03-20 DIAGNOSIS — O322XX0 Maternal care for transverse and oblique lie, not applicable or unspecified: Secondary | ICD-10-CM

## 2020-03-20 DIAGNOSIS — Z3A17 17 weeks gestation of pregnancy: Secondary | ICD-10-CM

## 2020-03-20 DIAGNOSIS — O09299 Supervision of pregnancy with other poor reproductive or obstetric history, unspecified trimester: Secondary | ICD-10-CM

## 2020-03-20 DIAGNOSIS — Z9889 Other specified postprocedural states: Secondary | ICD-10-CM

## 2020-03-26 ENCOUNTER — Other Ambulatory Visit: Payer: Self-pay | Admitting: Obstetrics and Gynecology

## 2020-03-26 ENCOUNTER — Other Ambulatory Visit: Payer: Self-pay

## 2020-03-26 ENCOUNTER — Other Ambulatory Visit: Payer: Self-pay | Admitting: *Deleted

## 2020-03-26 ENCOUNTER — Ambulatory Visit: Payer: Federal, State, Local not specified - PPO | Attending: Obstetrics and Gynecology

## 2020-03-26 ENCOUNTER — Encounter: Payer: Self-pay | Admitting: *Deleted

## 2020-03-26 ENCOUNTER — Ambulatory Visit: Payer: Federal, State, Local not specified - PPO | Admitting: *Deleted

## 2020-03-26 VITALS — BP 125/74 | HR 94

## 2020-03-26 DIAGNOSIS — E669 Obesity, unspecified: Secondary | ICD-10-CM | POA: Diagnosis not present

## 2020-03-26 DIAGNOSIS — O3432 Maternal care for cervical incompetence, second trimester: Secondary | ICD-10-CM | POA: Diagnosis not present

## 2020-03-26 DIAGNOSIS — Z362 Encounter for other antenatal screening follow-up: Secondary | ICD-10-CM

## 2020-03-26 DIAGNOSIS — Z3A18 18 weeks gestation of pregnancy: Secondary | ICD-10-CM

## 2020-03-26 DIAGNOSIS — O09299 Supervision of pregnancy with other poor reproductive or obstetric history, unspecified trimester: Secondary | ICD-10-CM | POA: Insufficient documentation

## 2020-03-26 DIAGNOSIS — O3430 Maternal care for cervical incompetence, unspecified trimester: Secondary | ICD-10-CM

## 2020-03-26 DIAGNOSIS — Z9889 Other specified postprocedural states: Secondary | ICD-10-CM | POA: Diagnosis not present

## 2020-03-26 DIAGNOSIS — O09292 Supervision of pregnancy with other poor reproductive or obstetric history, second trimester: Secondary | ICD-10-CM

## 2020-03-26 DIAGNOSIS — O99212 Obesity complicating pregnancy, second trimester: Secondary | ICD-10-CM | POA: Diagnosis not present

## 2020-03-26 DIAGNOSIS — Z3686 Encounter for antenatal screening for cervical length: Secondary | ICD-10-CM

## 2020-03-27 ENCOUNTER — Telehealth (HOSPITAL_COMMUNITY): Payer: Self-pay | Admitting: *Deleted

## 2020-03-27 ENCOUNTER — Other Ambulatory Visit: Payer: Self-pay | Admitting: Obstetrics and Gynecology

## 2020-03-27 ENCOUNTER — Encounter (HOSPITAL_COMMUNITY): Payer: Self-pay

## 2020-03-27 ENCOUNTER — Encounter (HOSPITAL_COMMUNITY): Payer: Self-pay | Admitting: *Deleted

## 2020-03-27 NOTE — H&P (Deleted)
  The note originally documented on this encounter has been moved the the encounter in which it belongs.  

## 2020-03-27 NOTE — H&P (Signed)
April Burke is a 29 y.o. female at 18 wks and 3 days presenting for revision of cervical cerclage. She has a h/o cervical incompetence with previous pregnancies. She failed cerclage with her last pregnancy.  Patient has been followed by [facility]. She was evaluated by Dr. Jasmine Awe on 03/26/2020 at which time. Cervical funnelling was noted past the current cerclage.  Prenatal has been provided by Dr. Drema Dallas with [facility].   . OB History    Gravida  5   Para  0   Term      Preterm  0   AB  4   Living  0     SAB  2   TAB  1   Ectopic      Multiple      Live Births           Obstetric Comments  Failed rescue cerclage       Past Medical History:  Diagnosis Date  . Family history of adverse reaction to anesthesia    sister allergic swollen tongue  . Incompetent cervix   . Medical history non-contributory    Past Surgical History:  Procedure Laterality Date  . CERVICAL CERCLAGE  2017   Failed rescue cerclage  . CERVICAL CERCLAGE N/A 02/26/2020   Procedure: CERCLAGE CERVICAL;  Surgeon: Drema Dallas, DO;  Location: [facility] LD ORS;  Service: Gynecology;  Laterality: N/A;  . DILATION AND CURETTAGE OF UTERUS     Family History: family history includes Hypertension in her father. Social History:  reports that she has never smoked. She has never used smokeless tobacco. She reports previous alcohol use. She reports previous drug use. Drug: Marijuana.     Maternal Diabetes: No Genetic Screening: Declined Maternal Ultrasounds/Referrals: Normal Fetal Ultrasounds or other Referrals:  None Maternal Substance Abuse:  No Significant Maternal Medications:  None Significant Maternal Lab Results:  None Other Comments:  None  Review of Systems  Constitutional: Negative.   HENT: Negative.   Eyes: Negative.   Respiratory: Negative.   Cardiovascular: Negative.   Gastrointestinal: Negative.   Endocrine: Negative.   Genitourinary: Negative.    Musculoskeletal: Negative.   Skin: Negative.   Allergic/Immunologic: Negative.   Neurological: Negative.   Hematological: Negative.   Psychiatric/Behavioral: Negative.    History   Last menstrual period 11/13/2019. Maternal Exam:  Introitus: Normal vulva.   Physical Exam Vitals reviewed.  Constitutional:      Appearance: Normal appearance.  HENT:     Head: Normocephalic.     Nose: Nose normal.     Mouth/Throat:     Mouth: Mucous membranes are moist.  Eyes:     Pupils: Pupils are equal, round, and reactive to light.  Cardiovascular:     Rate and Rhythm: Normal rate and regular rhythm.     Pulses: Normal pulses.     Heart sounds: Normal heart sounds.  Pulmonary:     Effort: Pulmonary effort is normal.     Breath sounds: Normal breath sounds.  Abdominal:     Tenderness: There is no abdominal tenderness.  Genitourinary:    General: Normal vulva.  Musculoskeletal:        General: Normal range of motion.     Cervical back: Normal range of motion and neck supple.  Skin:    General: Skin is warm.  Neurological:     General: No focal deficit present.     Mental Status: She is alert and oriented to person, place, and time.  Psychiatric:        Mood and Affect: Mood normal.        Behavior: Behavior normal.     Prenatal labs: ABO, Rh: --/--/B POS (09/20 3779) Antibody: NEG (09/20 3968) Rubella:  Nonimmune  RPR:   Nonreactive HBsAg:   Negative  HIV:   Negative  GBS:     Assessment/Plan: 18 wks and 3 days with cervical insufficiency. Failing initial cerclage placement  Per Dr. Tama High pt to have revision of cervical cerclage. He has counselled patient regarding risk benefits and alternatives.  Christophe Louis 03/27/2020, 1:01 PM

## 2020-03-27 NOTE — Telephone Encounter (Signed)
Preadmission screen Coming in at 0815 for covid test and cerclage.  NPO after midnight.  Understands plan of care and questions answered.

## 2020-03-28 ENCOUNTER — Other Ambulatory Visit: Payer: Self-pay

## 2020-03-28 ENCOUNTER — Observation Stay (HOSPITAL_COMMUNITY): Payer: Federal, State, Local not specified - PPO | Admitting: Certified Registered Nurse Anesthetist

## 2020-03-28 ENCOUNTER — Observation Stay (HOSPITAL_COMMUNITY)
Admission: RE | Admit: 2020-03-28 | Discharge: 2020-03-28 | Disposition: A | Discharge status: Home / Self Care | Payer: Federal, State, Local not specified - PPO | Attending: Obstetrics and Gynecology | Admitting: Obstetrics and Gynecology

## 2020-03-28 ENCOUNTER — Encounter (HOSPITAL_COMMUNITY): Payer: Self-pay | Admitting: Obstetrics and Gynecology

## 2020-03-28 ENCOUNTER — Encounter (HOSPITAL_COMMUNITY)
Admission: RE | Disposition: A | Discharge status: Home / Self Care | Payer: Self-pay | Source: Home / Self Care | Attending: Obstetrics and Gynecology

## 2020-03-28 ENCOUNTER — Encounter: Payer: Self-pay | Admitting: Obstetrics and Gynecology

## 2020-03-28 DIAGNOSIS — O3432 Maternal care for cervical incompetence, second trimester: Secondary | ICD-10-CM | POA: Diagnosis not present

## 2020-03-28 DIAGNOSIS — Z3A18 18 weeks gestation of pregnancy: Secondary | ICD-10-CM | POA: Insufficient documentation

## 2020-03-28 DIAGNOSIS — O09292 Supervision of pregnancy with other poor reproductive or obstetric history, second trimester: Secondary | ICD-10-CM | POA: Diagnosis not present

## 2020-03-28 DIAGNOSIS — N883 Incompetence of cervix uteri: Secondary | ICD-10-CM | POA: Diagnosis present

## 2020-03-28 DIAGNOSIS — Z20822 Contact with and (suspected) exposure to covid-19: Secondary | ICD-10-CM | POA: Diagnosis not present

## 2020-03-28 DIAGNOSIS — O26872 Cervical shortening, second trimester: Secondary | ICD-10-CM | POA: Diagnosis not present

## 2020-03-28 HISTORY — PX: CERVICAL CERCLAGE: SHX1329

## 2020-03-28 LAB — TYPE AND SCREEN
ABO/RH(D): B POS
Antibody Screen: NEGATIVE

## 2020-03-28 LAB — RESPIRATORY PANEL BY RT PCR (FLU A&B, COVID)
Influenza A by PCR: NEGATIVE
Influenza B by PCR: NEGATIVE
SARS Coronavirus 2 by RT PCR: NEGATIVE

## 2020-03-28 LAB — CBC
HCT: 34.5 % — ABNORMAL LOW (ref 36.0–46.0)
Hemoglobin: 11.3 g/dL — ABNORMAL LOW (ref 12.0–15.0)
MCH: 29.1 pg (ref 26.0–34.0)
MCHC: 32.8 g/dL (ref 30.0–36.0)
MCV: 88.9 fL (ref 80.0–100.0)
Platelets: 186 10*3/uL (ref 150–400)
RBC: 3.88 MIL/uL (ref 3.87–5.11)
RDW: 14 % (ref 11.5–15.5)
WBC: 6.1 10*3/uL (ref 4.0–10.5)
nRBC: 0 % (ref 0.0–0.2)

## 2020-03-28 SURGERY — CERCLAGE, CERVIX, VAGINAL APPROACH
Anesthesia: Spinal | Site: Vagina | Wound class: Clean Contaminated

## 2020-03-28 MED ORDER — ACETAMINOPHEN 500 MG PO TABS
ORAL_TABLET | ORAL | Status: AC
  Filled 2020-03-28: qty 2

## 2020-03-28 MED ORDER — ACETAMINOPHEN 500 MG PO TABS: 1000.0000 mg | ORAL_TABLET | ORAL | Status: DC

## 2020-03-28 MED ORDER — SODIUM CHLORIDE 0.9 % IR SOLN
Status: DC | PRN
  Administered 2020-03-28: 400 mL via INTRAVESICAL

## 2020-03-28 MED ORDER — ONDANSETRON HCL 4 MG/2ML IJ SOLN
INTRAMUSCULAR | Status: DC | PRN
  Administered 2020-03-28: 4 mg via INTRAVENOUS

## 2020-03-28 MED ORDER — PHENYLEPHRINE HCL (PRESSORS) 10 MG/ML IV SOLN
INTRAVENOUS | Status: DC | PRN
  Administered 2020-03-28 (×2): 80 ug via INTRAVENOUS

## 2020-03-28 MED ORDER — POVIDONE-IODINE 10 % EX SWAB
2.0000 "application " | Freq: Once | CUTANEOUS | Status: AC
  Administered 2020-03-28: 2 via TOPICAL

## 2020-03-28 MED ORDER — CHLOROPROCAINE HCL 50 MG/5ML IT SOLN
INTRATHECAL | Status: AC
  Filled 2020-03-28: qty 5

## 2020-03-28 MED ORDER — CHLOROPROCAINE HCL 50 MG/5ML IT SOLN
INTRATHECAL | Status: DC | PRN
  Administered 2020-03-28: 50 mg via INTRATHECAL

## 2020-03-28 MED ORDER — CHLOROPROCAINE HCL 1 % IJ SOLN: INTRAMUSCULAR | Status: DC | PRN

## 2020-03-28 MED ORDER — PROGESTERONE 200 MG PO CAPS: 200.0000 mg | ORAL_CAPSULE | Freq: Every day | ORAL | 4 refills | Status: DC

## 2020-03-28 MED ORDER — LACTATED RINGERS IV SOLN: INTRAVENOUS | Status: DC | PRN

## 2020-03-28 MED ORDER — LACTATED RINGERS IV SOLN: INTRAVENOUS | Status: DC

## 2020-03-28 MED ORDER — FENTANYL CITRATE (PF) 100 MCG/2ML IJ SOLN
INTRAMUSCULAR | Status: AC
  Filled 2020-03-28: qty 2

## 2020-03-28 MED ORDER — ONDANSETRON HCL 4 MG/2ML IJ SOLN
INTRAMUSCULAR | Status: AC
  Filled 2020-03-28: qty 2

## 2020-03-28 SURGICAL SUPPLY — 17 items
CANISTER SUCT 3000ML PPV (MISCELLANEOUS) ×2 IMPLANT
GLOVE BIOGEL M 6.5 STRL (GLOVE) ×4 IMPLANT
GLOVE BIOGEL PI IND STRL 6.5 (GLOVE) ×1 IMPLANT
GLOVE BIOGEL PI IND STRL 7.0 (GLOVE) ×1 IMPLANT
GLOVE BIOGEL PI INDICATOR 6.5 (GLOVE) ×1
GLOVE BIOGEL PI INDICATOR 7.0 (GLOVE) ×1
GOWN STRL REUS W/TWL LRG LVL3 (GOWN DISPOSABLE) ×4 IMPLANT
PACK VAGINAL MINOR WOMEN LF (CUSTOM PROCEDURE TRAY) ×2 IMPLANT
PAD OB MATERNITY 4.3X12.25 (PERSONAL CARE ITEMS) ×2 IMPLANT
PAD PREP 24X48 CUFFED NSTRL (MISCELLANEOUS) ×2 IMPLANT
PENCIL BUTTON HOLSTER BLD 10FT (ELECTRODE) ×2 IMPLANT
SUT MERSILENE FIBER S 5 MO-4 1 (SUTURE) ×2 IMPLANT
SUT PROLENE 1 CT 1 30 (SUTURE) ×2 IMPLANT
TOWEL OR 17X24 6PK STRL BLUE (TOWEL DISPOSABLE) ×4 IMPLANT
TRAY FOLEY W/BAG SLVR 14FR (SET/KITS/TRAYS/PACK) ×2 IMPLANT
TUBING NON-CON 1/4 X 20 CONN (TUBING) IMPLANT
YANKAUER SUCT BULB TIP NO VENT (SUCTIONS) ×4 IMPLANT

## 2020-03-28 NOTE — Brief Op Note (Signed)
03/28/2020  1:04 PM  PATIENT:  Raynald Kemp  29 y.o. female  PRE-OPERATIVE DIAGNOSIS:  18-weeks' gestation with cervical insufficiency  POST-OPERATIVE DIAGNOSIS:  Same with Revision Cerclage  PROCEDURE:   REVISION CERCLAGE  SURGEON:  Surgeon(s) and Role:   Tama High, MD-Primary Christophe Louis MD-Assisting   PHYSICIAN ASSISTANT:   ASSISTANTS: Christophe Louis, MD  ANESTHESIA:    Spinal  EBL:  20 milliliters  BLOOD ADMINISTERED:none  DRAINS: none   LOCAL MEDICATIONS USED:  NONE  SPECIMEN:  No Specimen  DISPOSITION OF SPECIMEN:  N/A  COUNTS:  YES  TOURNIQUET:  None  DICTATION: .Note written in EPIC  PLAN OF CARE: Discharge to home after PACU  PATIENT DISPOSITION:  PACU - hemodynamically stable.   Delay start of Pharmacological VTE agent (>24hrs) due to surgical blood loss or risk of bleeding: not applicable

## 2020-03-28 NOTE — Op Note (Signed)
Maternal-Fetal Medicine   Name: April Burke MRN: [id number] Procedure: Revision Cerclage   PRE-OPERATIVE DIAGNOSIS:  18-weeks' gestation with cervical insufficiency  POST-OPERATIVE DIAGNOSIS:  Same with Revision Cerclage   PROCEDURE:  Procedure(s): REVISION CERCLAGE CERVICAL   SURGEON:  Surgeon(s) and Role:  ** Tama High, MD - Primary   * Christophe Louis, MD - Assisting      ANESTHESIA:   spinal    I discussed procedure and complications in detail before obtaining consent in the Pre-op area. After informed consent, the patient was taken to the OR and after spinal anesthesia, the bladder was catheterized. The patient was prepped and draped in dorsal lithotomy position. A sterile weighted speculum was inserted.  The external os closed. The cervix Anteriorly, the cervix was about 2 cm long.   The cervix and vagina appeared healthy. Previous cerclage (proline) was seen. The anterior lip of the cervix was held with a ring forceps.  With help of Bovie, a small incision of about 2 cm was made at the cervicovesical junction and the bladder was gently pushed up. Vesicocervical space (bloodless) was clearly seen.   A Mersilene 5 tape stitch was placed circumferentially starting at 11 O'Clock position and around the cervix. Final stitch exited close to 1 O'Clock position.  All around, the stitch was placed as high as possible. Before placing the knot, 400 mL of saline was instilled Into the bladder by the nurse. This was to facilitate retraction of amniotic membranes into the uterine cavity. Rectal examination was performed and no inadvertent sutures were felt.  Five secure knots were placed anteriorly. The cerclage (new) is about 2 cm higher than the previous one. The external os was closed. Complete hemostasis was achieved. Counts were correct. Estimated blood loss: 20 milliliters.    I reassured the patient of successful procedure. I recommended that she continue taking vaginal progesterone  from tomorrow night. Patient left the operating room in stable condition. . -We made an appointment for [facility] to [facility] weeks to evaluate the cervix.  I recommended that she discontinue makena injections since her history is clearly consistent with cervical insufficiency.

## 2020-03-28 NOTE — Progress Notes (Signed)
Maternal-Fetal Medicine  April Burke, G5 P0 at 18w 3d gestation, is admitted for revision cerclage procedure. Obstetric history is significant for 2 mid-trimester pregnancy losses. In this pregnancy, she had prophylactic cerclage placed at 15-weeks' gestation. Patient was being followed [facility]. On ultrasound performed on 03/26/20, the residual-closed portion of the cervix measured 7 millimeters and the membranes had passed beyond the level of cerclage.  On sterile-speculum examination, the external os was closed and the cervix was about 1.5 cm long. Cerclage was seen (proline).  I had counseled the patient on revision cerclage and the patient opted to have revision cerclage.  P/E: Patient is comfortably lying in the bed; not in pain. Vitals stable Abd: Soft gravid uterus; no tenderness.  I explained the procedure and its complications including miscarriage, rupture of membranes, infection, bleeding, and injuries to bladder or bowel (rare). Patient agreed to undergo revision cerclage procedure. Informed consent was obtained.

## 2020-03-28 NOTE — Transfer of Care (Signed)
Immediate Anesthesia Transfer of Care Note  Patient: April Burke  Procedure(s) Performed: CERCLAGE CERVICAL (N/A Vagina )  Patient Location: PACU  Anesthesia Type:Spinal  Level of Consciousness: awake, alert  and oriented  Airway & Oxygen Therapy: Patient Spontanous Breathing  Post-op Assessment: Report given to RN and Post -op Vital signs reviewed and stable  Post vital signs: Reviewed and stable  Last Vitals:  Vitals Value Taken Time  BP 105/61 03/28/20 1300  Temp    Pulse 79 03/28/20 1305  Resp 19 03/28/20 1305  SpO2 99 % 03/28/20 1305  Vitals shown include unvalidated device data.  Last Pain:  Vitals:   03/28/20 1300  TempSrc: (P) Oral  PainSc: (P) 0-No pain      Patients Stated Pain Goal: 8 (16/10/96 0454)  Complications: No complications documented.

## 2020-03-28 NOTE — Anesthesia Preprocedure Evaluation (Signed)
Anesthesia Evaluation  Patient identified by MRN, date of birth, ID band Patient awake    Reviewed: Patient's Chart, lab work & pertinent test results  Airway Mallampati: II  TM Distance: >3 FB Neck ROM: Full    Dental  (+) Teeth Intact   Pulmonary neg pulmonary ROS,    Pulmonary exam normal        Cardiovascular negative cardio ROS   Rhythm:Regular Rate:Normal     Neuro/Psych negative neurological ROS  negative psych ROS   GI/Hepatic negative GI ROS, Neg liver ROS,   Endo/Other  negative endocrine ROS  Renal/GU negative Renal ROS  negative genitourinary   Musculoskeletal negative musculoskeletal ROS (+)   Abdominal (+)  Abdomen: soft. Bowel sounds: normal.  Peds  Hematology negative hematology ROS (+)   Anesthesia Other Findings   Reproductive/Obstetrics (+) Pregnancy                             Anesthesia Physical Anesthesia Plan  ASA: II  Anesthesia Plan: Spinal   Post-op Pain Management:    Induction:   PONV Risk Score and Plan: Ondansetron and Dexamethasone  Airway Management Planned: Nasal Cannula  Additional Equipment: None  Intra-op Plan:   Post-operative Plan:   Informed Consent: I have reviewed the patients History and Physical, chart, labs and discussed the procedure including the risks, benefits and alternatives for the proposed anesthesia with the patient or authorized representative who has indicated his/her understanding and acceptance.     Dental advisory given  Plan Discussed with: CRNA  Anesthesia Plan Comments: (Lab Results      Component                Value               Date                      WBC                      6.1                 03/28/2020                HGB                      11.3 (L)            03/28/2020                HCT                      34.5 (L)            03/28/2020                MCV                      88.9                 03/28/2020                PLT                      186                 03/28/2020          )  Anesthesia Quick Evaluation  

## 2020-03-28 NOTE — Anesthesia Postprocedure Evaluation (Signed)
Anesthesia Post Note  Patient: Scientist, product/process development  Procedure(s) Performed: CERCLAGE CERVICAL (N/A Vagina )     Patient location during evaluation: Mother Baby Anesthesia Type: Spinal Level of consciousness: oriented and awake and alert Pain management: pain level controlled Vital Signs Assessment: post-procedure vital signs reviewed and stable Respiratory status: spontaneous breathing and respiratory function stable Cardiovascular status: blood pressure returned to baseline and stable Postop Assessment: no headache, no backache, no apparent nausea or vomiting and able to ambulate Anesthetic complications: no   No complications documented.  Last Vitals:  Vitals:   03/28/20 1515 03/28/20 1530  BP: 124/70 123/70  Pulse: 72 81  Resp: 19 15  Temp:    SpO2: 100% 100%    Last Pain:  Vitals:   03/28/20 1350  TempSrc:   PainSc: 3                  Eddis Pingleton P Laithan Conchas

## 2020-03-28 NOTE — Anesthesia Procedure Notes (Signed)
Spinal  Patient location during procedure: OR Start time: 03/28/2020 11:55 AM End time: 03/28/2020 11:58 AM Staffing Performed: anesthesiologist  Anesthesiologist: Darral Dash, DO Preanesthetic Checklist Completed: patient identified, IV checked, site marked, risks and benefits discussed, surgical consent, monitors and equipment checked, pre-op evaluation and timeout performed Spinal Block Patient position: sitting Prep: DuraPrep Patient monitoring: heart rate, cardiac monitor, continuous pulse ox and blood pressure Approach: midline Location: L3-4 Injection technique: single-shot Needle Needle type: Pencan  Needle gauge: 24 G Needle length: 10 cm Assessment Sensory level: T6 Additional Notes Patient identified. Risks/Benefits/Options discussed with patient including but not limited to bleeding, infection, nerve damage, paralysis, failed block, incomplete pain control, headache, blood pressure changes, nausea, vomiting, reactions to medications, itching and postpartum back pain. Confirmed with bedside nurse the patient's most recent platelet count. Confirmed with patient that they are not currently taking any anticoagulation, have any bleeding history or any family history of bleeding disorders. Patient expressed understanding and wished to proceed. All questions were answered. Sterile technique was used throughout the entire procedure. Please see nursing notes for vital signs. Warning signs of high block given to the patient including shortness of breath, tingling/numbness in hands, complete motor block, or any concerning symptoms with instructions to call for help. Patient was given instructions on fall risk and not to get out of bed. All questions and concerns addressed with instructions to call with any issues or inadequate analgesia.

## 2020-03-29 ENCOUNTER — Encounter (HOSPITAL_COMMUNITY): Payer: Self-pay | Admitting: Obstetrics and Gynecology

## 2020-03-29 LAB — RPR: RPR Ser Ql: NONREACTIVE

## 2020-04-09 ENCOUNTER — Encounter: Payer: Self-pay | Admitting: *Deleted

## 2020-04-09 ENCOUNTER — Other Ambulatory Visit: Payer: Self-pay | Admitting: Obstetrics and Gynecology

## 2020-04-09 ENCOUNTER — Ambulatory Visit: Payer: Federal, State, Local not specified - PPO | Attending: Obstetrics and Gynecology

## 2020-04-09 ENCOUNTER — Other Ambulatory Visit: Payer: Self-pay

## 2020-04-09 ENCOUNTER — Ambulatory Visit: Payer: Federal, State, Local not specified - PPO | Admitting: *Deleted

## 2020-04-09 VITALS — BP 129/76 | HR 96

## 2020-04-09 DIAGNOSIS — O3432 Maternal care for cervical incompetence, second trimester: Secondary | ICD-10-CM

## 2020-04-09 DIAGNOSIS — O99212 Obesity complicating pregnancy, second trimester: Secondary | ICD-10-CM

## 2020-04-09 DIAGNOSIS — O09292 Supervision of pregnancy with other poor reproductive or obstetric history, second trimester: Secondary | ICD-10-CM

## 2020-04-09 DIAGNOSIS — Z3686 Encounter for antenatal screening for cervical length: Secondary | ICD-10-CM

## 2020-04-09 DIAGNOSIS — Z3A20 20 weeks gestation of pregnancy: Secondary | ICD-10-CM

## 2020-04-09 DIAGNOSIS — E669 Obesity, unspecified: Secondary | ICD-10-CM | POA: Diagnosis not present

## 2020-04-09 DIAGNOSIS — Z362 Encounter for other antenatal screening follow-up: Secondary | ICD-10-CM

## 2020-04-23 ENCOUNTER — Other Ambulatory Visit: Payer: Self-pay

## 2020-04-23 ENCOUNTER — Ambulatory Visit: Payer: Federal, State, Local not specified - PPO | Admitting: *Deleted

## 2020-04-23 ENCOUNTER — Ambulatory Visit: Payer: Federal, State, Local not specified - PPO | Attending: Obstetrics and Gynecology

## 2020-04-23 ENCOUNTER — Other Ambulatory Visit: Payer: Self-pay | Admitting: *Deleted

## 2020-04-23 VITALS — BP 106/59 | HR 130

## 2020-04-23 DIAGNOSIS — Z3686 Encounter for antenatal screening for cervical length: Secondary | ICD-10-CM

## 2020-04-23 DIAGNOSIS — Z362 Encounter for other antenatal screening follow-up: Secondary | ICD-10-CM

## 2020-04-23 DIAGNOSIS — O3432 Maternal care for cervical incompetence, second trimester: Secondary | ICD-10-CM | POA: Diagnosis not present

## 2020-04-23 DIAGNOSIS — E669 Obesity, unspecified: Secondary | ICD-10-CM

## 2020-04-23 DIAGNOSIS — Z3A22 22 weeks gestation of pregnancy: Secondary | ICD-10-CM

## 2020-04-23 DIAGNOSIS — O99212 Obesity complicating pregnancy, second trimester: Secondary | ICD-10-CM

## 2020-04-23 DIAGNOSIS — O09292 Supervision of pregnancy with other poor reproductive or obstetric history, second trimester: Secondary | ICD-10-CM

## 2020-04-23 NOTE — Progress Notes (Signed)
Pulse 110 by palpation

## 2020-05-14 DIAGNOSIS — R531 Weakness: Secondary | ICD-10-CM | POA: Diagnosis not present

## 2020-05-21 ENCOUNTER — Other Ambulatory Visit: Payer: Self-pay

## 2020-05-21 ENCOUNTER — Encounter: Payer: Self-pay | Admitting: *Deleted

## 2020-05-21 ENCOUNTER — Ambulatory Visit: Payer: Federal, State, Local not specified - PPO | Attending: Obstetrics and Gynecology

## 2020-05-21 ENCOUNTER — Ambulatory Visit: Payer: Federal, State, Local not specified - PPO | Admitting: *Deleted

## 2020-05-21 VITALS — BP 110/77 | HR 114

## 2020-05-21 DIAGNOSIS — O09292 Supervision of pregnancy with other poor reproductive or obstetric history, second trimester: Secondary | ICD-10-CM | POA: Diagnosis not present

## 2020-05-21 DIAGNOSIS — N883 Incompetence of cervix uteri: Secondary | ICD-10-CM | POA: Insufficient documentation

## 2020-05-21 DIAGNOSIS — O99212 Obesity complicating pregnancy, second trimester: Secondary | ICD-10-CM

## 2020-05-21 DIAGNOSIS — E669 Obesity, unspecified: Secondary | ICD-10-CM

## 2020-05-21 DIAGNOSIS — O3432 Maternal care for cervical incompetence, second trimester: Secondary | ICD-10-CM

## 2020-05-21 DIAGNOSIS — Z3A26 26 weeks gestation of pregnancy: Secondary | ICD-10-CM

## 2020-06-06 DIAGNOSIS — Z3492 Encounter for supervision of normal pregnancy, unspecified, second trimester: Secondary | ICD-10-CM | POA: Diagnosis not present

## 2020-06-21 DIAGNOSIS — Z3490 Encounter for supervision of normal pregnancy, unspecified, unspecified trimester: Secondary | ICD-10-CM | POA: Diagnosis not present

## 2020-06-21 DIAGNOSIS — O09299 Supervision of pregnancy with other poor reproductive or obstetric history, unspecified trimester: Secondary | ICD-10-CM | POA: Diagnosis not present

## 2020-06-21 DIAGNOSIS — Z23 Encounter for immunization: Secondary | ICD-10-CM | POA: Diagnosis not present

## 2020-07-03 DIAGNOSIS — Z0000 Encounter for general adult medical examination without abnormal findings: Secondary | ICD-10-CM | POA: Diagnosis not present

## 2020-07-04 ENCOUNTER — Other Ambulatory Visit: Payer: Self-pay

## 2020-07-04 ENCOUNTER — Encounter: Payer: Federal, State, Local not specified - PPO | Attending: Obstetrics and Gynecology | Admitting: Registered"

## 2020-07-04 ENCOUNTER — Ambulatory Visit: Payer: Federal, State, Local not specified - PPO | Admitting: Registered"

## 2020-07-04 DIAGNOSIS — O24419 Gestational diabetes mellitus in pregnancy, unspecified control: Secondary | ICD-10-CM | POA: Insufficient documentation

## 2020-07-04 NOTE — Progress Notes (Unsigned)
Patient was seen on 07/04/20 for Gestational Diabetes self-management. EDD 08/26/20. Patient states no history of GDM.  This patient is accompanied in the office by her significant other.  Pt states they recently moved here from [place]. Patient's SO likes to cook and was interested in learning how to balance out the meals to help with patient's blood sugar control. Pt states she has been drinking regular soda but will switch to diet soda. Pt states her MD has prescribed metformin and patient had questions about the medication. RD included in the GDM education.   The following learning objectives were met by the patient :   States the definition of Gestational Diabetes  States why dietary management is important in controlling blood glucose  Describes the effects of carbohydrates on blood glucose levels  Demonstrates ability to create a balanced meal plan  Demonstrates carbohydrate counting   States when to check blood glucose levels  Demonstrates proper blood glucose monitoring techniques  States the effect of stress and exercise on blood glucose levels  States the importance of limiting caffeine and abstaining from alcohol and smoking  Plan:  Aim for 3 Carbohydrate Choices per meal (45 grams) +/- 1 either way  Aim for 1-2 Carbohydrate Choices per snack Begin reading food labels for Total Carbohydrate of foods If OK with your MD, consider  increasing your activity level by walking, Arm Chair Exercises or other activity daily as tolerated Begin checking Blood Glucose before breakfast and 2 hours after first bite of breakfast, lunch and dinner as directed by MD  Bring Log Book/Sheet and meter to every medical appointment  Patient to record blood sugar on glucose log sheet  Take medication if directed by MD  Blood glucose monitor given: none - has meter and is checking blood sugar Pt has recorded some readings on her phone: FBS: 100-118 mg/dL; postmeals 122-169 mg/dL  Patient  instructed to monitor glucose levels: FBS: 60 - 95 mg/dl 2 hour: <120 mg/dl  Patient received the following handouts:  Nutrition Diabetes and Pregnancy  Carbohydrate Counting List  Blood glucose Log Sheet  Patient will be seen for follow-up as needed.

## 2020-07-08 ENCOUNTER — Other Ambulatory Visit: Payer: Self-pay

## 2020-07-18 DIAGNOSIS — O24419 Gestational diabetes mellitus in pregnancy, unspecified control: Secondary | ICD-10-CM | POA: Diagnosis not present

## 2020-07-18 DIAGNOSIS — O99213 Obesity complicating pregnancy, third trimester: Secondary | ICD-10-CM | POA: Diagnosis not present

## 2020-07-25 DIAGNOSIS — O24419 Gestational diabetes mellitus in pregnancy, unspecified control: Secondary | ICD-10-CM | POA: Diagnosis not present

## 2020-07-25 DIAGNOSIS — Z113 Encounter for screening for infections with a predominantly sexual mode of transmission: Secondary | ICD-10-CM | POA: Diagnosis not present

## 2020-07-25 DIAGNOSIS — O99213 Obesity complicating pregnancy, third trimester: Secondary | ICD-10-CM | POA: Diagnosis not present

## 2020-07-25 DIAGNOSIS — N898 Other specified noninflammatory disorders of vagina: Secondary | ICD-10-CM | POA: Diagnosis not present

## 2020-07-25 DIAGNOSIS — Z3490 Encounter for supervision of normal pregnancy, unspecified, unspecified trimester: Secondary | ICD-10-CM | POA: Diagnosis not present

## 2020-08-01 DIAGNOSIS — O24419 Gestational diabetes mellitus in pregnancy, unspecified control: Secondary | ICD-10-CM | POA: Diagnosis not present

## 2020-08-01 DIAGNOSIS — O99213 Obesity complicating pregnancy, third trimester: Secondary | ICD-10-CM | POA: Diagnosis not present

## 2020-08-07 ENCOUNTER — Other Ambulatory Visit: Payer: Self-pay

## 2020-08-07 ENCOUNTER — Encounter (HOSPITAL_COMMUNITY): Payer: Self-pay | Admitting: Obstetrics and Gynecology

## 2020-08-07 ENCOUNTER — Inpatient Hospital Stay (HOSPITAL_COMMUNITY): Payer: Federal, State, Local not specified - PPO | Admitting: Certified Registered Nurse Anesthetist

## 2020-08-07 ENCOUNTER — Inpatient Hospital Stay (HOSPITAL_COMMUNITY)
Admission: AD | Admit: 2020-08-07 | Discharge: 2020-08-10 | DRG: 786 | Disposition: A | Discharge status: Home / Self Care | Payer: Federal, State, Local not specified - PPO | Attending: Obstetrics and Gynecology | Admitting: Obstetrics and Gynecology

## 2020-08-07 ENCOUNTER — Encounter (HOSPITAL_COMMUNITY)
Admission: AD | Disposition: A | Discharge status: Home / Self Care | Payer: Self-pay | Source: Home / Self Care | Attending: Obstetrics and Gynecology

## 2020-08-07 DIAGNOSIS — E669 Obesity, unspecified: Secondary | ICD-10-CM | POA: Diagnosis not present

## 2020-08-07 DIAGNOSIS — O24429 Gestational diabetes mellitus in childbirth, unspecified control: Secondary | ICD-10-CM | POA: Diagnosis not present

## 2020-08-07 DIAGNOSIS — Z20822 Contact with and (suspected) exposure to covid-19: Secondary | ICD-10-CM | POA: Diagnosis not present

## 2020-08-07 DIAGNOSIS — Z3A37 37 weeks gestation of pregnancy: Secondary | ICD-10-CM

## 2020-08-07 DIAGNOSIS — Z23 Encounter for immunization: Secondary | ICD-10-CM | POA: Diagnosis not present

## 2020-08-07 DIAGNOSIS — O9902 Anemia complicating childbirth: Secondary | ICD-10-CM | POA: Diagnosis not present

## 2020-08-07 DIAGNOSIS — O99214 Obesity complicating childbirth: Secondary | ICD-10-CM | POA: Diagnosis not present

## 2020-08-07 DIAGNOSIS — D259 Leiomyoma of uterus, unspecified: Secondary | ICD-10-CM | POA: Diagnosis present

## 2020-08-07 DIAGNOSIS — O43893 Other placental disorders, third trimester: Secondary | ICD-10-CM | POA: Diagnosis not present

## 2020-08-07 DIAGNOSIS — D649 Anemia, unspecified: Secondary | ICD-10-CM | POA: Diagnosis not present

## 2020-08-07 DIAGNOSIS — O3433 Maternal care for cervical incompetence, third trimester: Secondary | ICD-10-CM | POA: Diagnosis present

## 2020-08-07 DIAGNOSIS — O99213 Obesity complicating pregnancy, third trimester: Secondary | ICD-10-CM | POA: Diagnosis not present

## 2020-08-07 DIAGNOSIS — O24425 Gestational diabetes mellitus in childbirth, controlled by oral hypoglycemic drugs: Secondary | ICD-10-CM | POA: Diagnosis not present

## 2020-08-07 DIAGNOSIS — O3413 Maternal care for benign tumor of corpus uteri, third trimester: Secondary | ICD-10-CM | POA: Diagnosis not present

## 2020-08-07 DIAGNOSIS — O321XX0 Maternal care for breech presentation, not applicable or unspecified: Principal | ICD-10-CM | POA: Diagnosis present

## 2020-08-07 DIAGNOSIS — O411230 Chorioamnionitis, third trimester, not applicable or unspecified: Secondary | ICD-10-CM | POA: Diagnosis not present

## 2020-08-07 DIAGNOSIS — O2412 Pre-existing diabetes mellitus, type 2, in childbirth: Secondary | ICD-10-CM | POA: Diagnosis not present

## 2020-08-07 DIAGNOSIS — O24419 Gestational diabetes mellitus in pregnancy, unspecified control: Secondary | ICD-10-CM | POA: Diagnosis not present

## 2020-08-07 HISTORY — PX: CERVICAL CERCLAGE: SHX1329

## 2020-08-07 LAB — CBC
HCT: 32.2 % — ABNORMAL LOW (ref 36.0–46.0)
Hemoglobin: 9.9 g/dL — ABNORMAL LOW (ref 12.0–15.0)
MCH: 25.6 pg — ABNORMAL LOW (ref 26.0–34.0)
MCHC: 30.7 g/dL (ref 30.0–36.0)
MCV: 83.4 fL (ref 80.0–100.0)
Platelets: 215 10*3/uL (ref 150–400)
RBC: 3.86 MIL/uL — ABNORMAL LOW (ref 3.87–5.11)
RDW: 16.5 % — ABNORMAL HIGH (ref 11.5–15.5)
WBC: 5.7 10*3/uL (ref 4.0–10.5)
nRBC: 0 % (ref 0.0–0.2)

## 2020-08-07 LAB — RESP PANEL BY RT-PCR (FLU A&B, COVID) ARPGX2
Influenza A by PCR: NEGATIVE
Influenza B by PCR: NEGATIVE
SARS Coronavirus 2 by RT PCR: NEGATIVE

## 2020-08-07 LAB — TYPE AND SCREEN
ABO/RH(D): B POS
Antibody Screen: NEGATIVE

## 2020-08-07 LAB — GLUCOSE, CAPILLARY
Glucose-Capillary: 70 mg/dL (ref 70–99)
Glucose-Capillary: 63 mg/dL — ABNORMAL LOW (ref 70–99)
Glucose-Capillary: 75 mg/dL (ref 70–99)
Glucose-Capillary: 64 mg/dL — ABNORMAL LOW (ref 70–99)
Glucose-Capillary: 79 mg/dL (ref 70–99)

## 2020-08-07 SURGERY — Surgical Case
Anesthesia: Spinal

## 2020-08-07 MED ORDER — MORPHINE SULFATE (PF) 0.5 MG/ML IJ SOLN
INTRAMUSCULAR | Status: DC | PRN
  Administered 2020-08-07: 150 ug via EPIDURAL

## 2020-08-07 MED ORDER — LACTATED RINGERS IV SOLN: INTRAVENOUS | Status: DC | PRN

## 2020-08-07 MED ORDER — KETOROLAC TROMETHAMINE 30 MG/ML IJ SOLN
INTRAMUSCULAR | Status: AC
  Filled 2020-08-07: qty 1

## 2020-08-07 MED ORDER — MENTHOL 3 MG MT LOZG: 1.0000 | LOZENGE | OROMUCOSAL | Status: DC | PRN

## 2020-08-07 MED ORDER — SOD CITRATE-CITRIC ACID 500-334 MG/5ML PO SOLN
30.0000 mL | ORAL | Status: AC
  Administered 2020-08-07: 30 mL via ORAL

## 2020-08-07 MED ORDER — ZOLPIDEM TARTRATE 5 MG PO TABS: 5.0000 mg | ORAL_TABLET | Freq: Every evening | ORAL | Status: DC | PRN

## 2020-08-07 MED ORDER — ONDANSETRON HCL 4 MG/2ML IJ SOLN
INTRAMUSCULAR | Status: DC | PRN
  Administered 2020-08-07: 4 mg via INTRAVENOUS

## 2020-08-07 MED ORDER — BUPIVACAINE IN DEXTROSE 0.75-8.25 % IT SOLN
INTRATHECAL | Status: DC | PRN
  Administered 2020-08-07: 13.5 mg via INTRATHECAL

## 2020-08-07 MED ORDER — DIPHENHYDRAMINE HCL 50 MG/ML IJ SOLN: 12.5000 mg | INTRAMUSCULAR | Status: DC | PRN

## 2020-08-07 MED ORDER — DIPHENHYDRAMINE HCL 25 MG PO CAPS: 25.0000 mg | ORAL_CAPSULE | Freq: Four times a day (QID) | ORAL | Status: DC | PRN

## 2020-08-07 MED ORDER — ONDANSETRON HCL 4 MG/2ML IJ SOLN: 4.0000 mg | Freq: Once | INTRAMUSCULAR | Status: DC | PRN

## 2020-08-07 MED ORDER — SILVER NITRATE-POT NITRATE 75-25 % EX MISC
CUTANEOUS | Status: DC | PRN
  Administered 2020-08-07: 2

## 2020-08-07 MED ORDER — DIBUCAINE (PERIANAL) 1 % EX OINT: 1.0000 "application " | TOPICAL_OINTMENT | CUTANEOUS | Status: DC | PRN

## 2020-08-07 MED ORDER — OXYTOCIN-SODIUM CHLORIDE 30-0.9 UT/500ML-% IV SOLN
INTRAVENOUS | Status: AC
  Filled 2020-08-07: qty 500

## 2020-08-07 MED ORDER — MORPHINE SULFATE (PF) 2 MG/ML IV SOLN: 1.0000 mg | INTRAVENOUS | Status: DC | PRN

## 2020-08-07 MED ORDER — HYDROMORPHONE HCL 1 MG/ML IJ SOLN: 0.2500 mg | INTRAMUSCULAR | Status: DC | PRN

## 2020-08-07 MED ORDER — WITCH HAZEL-GLYCERIN EX PADS: 1.0000 "application " | MEDICATED_PAD | CUTANEOUS | Status: DC | PRN

## 2020-08-07 MED ORDER — PRENATAL MULTIVITAMIN CH
1.0000 | ORAL_TABLET | Freq: Every day | ORAL | Status: DC
  Administered 2020-08-08 – 2020-08-09 (×2): 1 via ORAL
  Filled 2020-08-07 (×2): qty 1

## 2020-08-07 MED ORDER — DIPHENHYDRAMINE HCL 25 MG PO CAPS: 25.0000 mg | ORAL_CAPSULE | ORAL | Status: DC | PRN

## 2020-08-07 MED ORDER — PHENYLEPHRINE HCL (PRESSORS) 10 MG/ML IV SOLN
INTRAVENOUS | Status: DC | PRN
  Administered 2020-08-07 (×3): 80 ug via INTRAVENOUS

## 2020-08-07 MED ORDER — PHENYLEPHRINE HCL-NACL 20-0.9 MG/250ML-% IV SOLN
INTRAVENOUS | Status: DC | PRN
  Administered 2020-08-07: 60 ug/min via INTRAVENOUS

## 2020-08-07 MED ORDER — NALBUPHINE HCL 10 MG/ML IJ SOLN: 5.0000 mg | Freq: Once | INTRAMUSCULAR | Status: DC | PRN

## 2020-08-07 MED ORDER — SIMETHICONE 80 MG PO CHEW: 80.0000 mg | CHEWABLE_TABLET | ORAL | Status: DC | PRN

## 2020-08-07 MED ORDER — SODIUM CHLORIDE 0.9 % IV SOLN
INTRAVENOUS | Status: AC | PRN
  Administered 2020-08-07 (×2): 1000 mL

## 2020-08-07 MED ORDER — OXYCODONE HCL 5 MG PO TABS
5.0000 mg | ORAL_TABLET | ORAL | Status: DC | PRN
Start: 2020-08-07 — End: 2020-08-10
  Administered 2020-08-09: 5 mg via ORAL
  Filled 2020-08-07: qty 1

## 2020-08-07 MED ORDER — ACETAMINOPHEN 500 MG PO TABS
1000.0000 mg | ORAL_TABLET | Freq: Four times a day (QID) | ORAL | Status: DC
  Administered 2020-08-07 – 2020-08-10 (×7): 1000 mg via ORAL
  Filled 2020-08-07 (×10): qty 2

## 2020-08-07 MED ORDER — SODIUM CHLORIDE 0.9% FLUSH: 3.0000 mL | INTRAVENOUS | Status: DC | PRN

## 2020-08-07 MED ORDER — NALOXONE HCL 0.4 MG/ML IJ SOLN: 0.4000 mg | INTRAMUSCULAR | Status: DC | PRN

## 2020-08-07 MED ORDER — CEFAZOLIN SODIUM-DEXTROSE 2-4 GM/100ML-% IV SOLN
2.0000 g | INTRAVENOUS | Status: AC
  Administered 2020-08-07: 2 g via INTRAVENOUS

## 2020-08-07 MED ORDER — OXYTOCIN-SODIUM CHLORIDE 30-0.9 UT/500ML-% IV SOLN
INTRAVENOUS | Status: DC | PRN
  Administered 2020-08-07: 400 mL via INTRAVENOUS

## 2020-08-07 MED ORDER — FENTANYL CITRATE (PF) 100 MCG/2ML IJ SOLN
INTRAMUSCULAR | Status: AC
  Filled 2020-08-07: qty 2

## 2020-08-07 MED ORDER — SILVER NITRATE-POT NITRATE 75-25 % EX MISC
CUTANEOUS | Status: AC
  Filled 2020-08-07: qty 10

## 2020-08-07 MED ORDER — STERILE WATER FOR IRRIGATION IR SOLN
Status: DC | PRN
  Administered 2020-08-07: 1000 mL

## 2020-08-07 MED ORDER — ONDANSETRON HCL 4 MG/2ML IJ SOLN: 4.0000 mg | Freq: Three times a day (TID) | INTRAMUSCULAR | Status: DC | PRN

## 2020-08-07 MED ORDER — NALBUPHINE HCL 10 MG/ML IJ SOLN: 5.0000 mg | INTRAMUSCULAR | Status: DC | PRN

## 2020-08-07 MED ORDER — DEXAMETHASONE SODIUM PHOSPHATE 4 MG/ML IJ SOLN
INTRAMUSCULAR | Status: AC
  Filled 2020-08-07: qty 1

## 2020-08-07 MED ORDER — OXYCODONE HCL 5 MG PO TABS: 5.0000 mg | ORAL_TABLET | Freq: Once | ORAL | Status: DC | PRN

## 2020-08-07 MED ORDER — OXYTOCIN-SODIUM CHLORIDE 30-0.9 UT/500ML-% IV SOLN
2.5000 [IU]/h | INTRAVENOUS | Status: AC
  Administered 2020-08-07 (×2): 2.5 [IU]/h via INTRAVENOUS
  Filled 2020-08-07: qty 500

## 2020-08-07 MED ORDER — LACTATED RINGERS IV SOLN: INTRAVENOUS | Status: DC

## 2020-08-07 MED ORDER — SCOPOLAMINE 1 MG/3DAYS TD PT72
1.0000 | MEDICATED_PATCH | Freq: Once | TRANSDERMAL | Status: DC
  Administered 2020-08-07: 1.5 mg via TRANSDERMAL

## 2020-08-07 MED ORDER — SENNOSIDES-DOCUSATE SODIUM 8.6-50 MG PO TABS
2.0000 | ORAL_TABLET | Freq: Every day | ORAL | Status: DC
  Administered 2020-08-08 – 2020-08-09 (×2): 2 via ORAL
  Filled 2020-08-07 (×2): qty 2

## 2020-08-07 MED ORDER — MORPHINE SULFATE (PF) 0.5 MG/ML IJ SOLN
INTRAMUSCULAR | Status: AC
  Filled 2020-08-07: qty 10

## 2020-08-07 MED ORDER — SOD CITRATE-CITRIC ACID 500-334 MG/5ML PO SOLN
ORAL | Status: AC
  Filled 2020-08-07: qty 30

## 2020-08-07 MED ORDER — ONDANSETRON HCL 4 MG/2ML IJ SOLN
INTRAMUSCULAR | Status: AC
  Filled 2020-08-07: qty 2

## 2020-08-07 MED ORDER — COCONUT OIL OIL
1.0000 "application " | TOPICAL_OIL | Status: DC | PRN
  Administered 2020-08-07: 1 via TOPICAL

## 2020-08-07 MED ORDER — KETOROLAC TROMETHAMINE 30 MG/ML IJ SOLN
30.0000 mg | Freq: Once | INTRAMUSCULAR | Status: AC | PRN
  Administered 2020-08-07: 30 mg via INTRAVENOUS

## 2020-08-07 MED ORDER — OXYCODONE HCL 5 MG/5ML PO SOLN: 5.0000 mg | Freq: Once | ORAL | Status: DC | PRN

## 2020-08-07 MED ORDER — CEFAZOLIN SODIUM-DEXTROSE 2-4 GM/100ML-% IV SOLN
INTRAVENOUS | Status: AC
  Filled 2020-08-07: qty 100

## 2020-08-07 MED ORDER — NALOXONE HCL 4 MG/10ML IJ SOLN
1.0000 ug/kg/h | INTRAVENOUS | Status: DC | PRN
  Filled 2020-08-07: qty 5

## 2020-08-07 MED ORDER — SIMETHICONE 80 MG PO CHEW
80.0000 mg | CHEWABLE_TABLET | Freq: Three times a day (TID) | ORAL | Status: DC
  Administered 2020-08-08 – 2020-08-10 (×6): 80 mg via ORAL
  Filled 2020-08-07 (×7): qty 1

## 2020-08-07 MED ORDER — DEXAMETHASONE SODIUM PHOSPHATE 4 MG/ML IJ SOLN
INTRAMUSCULAR | Status: DC | PRN
  Administered 2020-08-07: 4 mg via INTRAVENOUS

## 2020-08-07 MED ORDER — SCOPOLAMINE 1 MG/3DAYS TD PT72
MEDICATED_PATCH | TRANSDERMAL | Status: AC
  Filled 2020-08-07: qty 1

## 2020-08-07 MED ORDER — PHENYLEPHRINE HCL-NACL 20-0.9 MG/250ML-% IV SOLN
INTRAVENOUS | Status: AC
  Filled 2020-08-07: qty 250

## 2020-08-07 MED ORDER — FENTANYL CITRATE (PF) 100 MCG/2ML IJ SOLN
INTRAMUSCULAR | Status: DC | PRN
  Administered 2020-08-07: 15 ug via INTRAVENOUS

## 2020-08-07 MED ORDER — PHENYLEPHRINE 40 MCG/ML (10ML) SYRINGE FOR IV PUSH (FOR BLOOD PRESSURE SUPPORT)
PREFILLED_SYRINGE | INTRAVENOUS | Status: AC
  Filled 2020-08-07: qty 10

## 2020-08-07 SURGICAL SUPPLY — 35 items
BENZOIN TINCTURE PRP APPL 2/3 (GAUZE/BANDAGES/DRESSINGS) ×2 IMPLANT
CHLORAPREP W/TINT 26 (MISCELLANEOUS) ×2 IMPLANT
CLAMP CORD UMBIL (MISCELLANEOUS) IMPLANT
CLOTH BEACON ORANGE TIMEOUT ST (SAFETY) ×2 IMPLANT
DRAPE C SECTION CLR SCREEN (DRAPES) ×2 IMPLANT
DRSG OPSITE POSTOP 4X10 (GAUZE/BANDAGES/DRESSINGS) ×2 IMPLANT
ELECT REM PT RETURN 9FT ADLT (ELECTROSURGICAL) ×2
ELECTRODE REM PT RTRN 9FT ADLT (ELECTROSURGICAL) ×1 IMPLANT
EXTRACTOR VACUUM KIWI (MISCELLANEOUS) IMPLANT
GLOVE BIO SURGEON STRL SZ7 (GLOVE) ×2 IMPLANT
GLOVE BIOGEL PI IND STRL 7.0 (GLOVE) ×2 IMPLANT
GLOVE BIOGEL PI INDICATOR 7.0 (GLOVE) ×2
GLOVE SURG POLYISO LF SZ6.5 (GLOVE) ×2 IMPLANT
GOWN STRL REUS W/ TWL LRG LVL3 (GOWN DISPOSABLE) ×3 IMPLANT
GOWN STRL REUS W/TWL LRG LVL3 (GOWN DISPOSABLE) ×3
KIT ABG SYR 3ML LUER SLIP (SYRINGE) IMPLANT
NEEDLE HYPO 25X5/8 SAFETYGLIDE (NEEDLE) IMPLANT
NS IRRIG 1000ML POUR BTL (IV SOLUTION) ×2 IMPLANT
PACK C SECTION WH (CUSTOM PROCEDURE TRAY) ×2 IMPLANT
PAD OB MATERNITY 4.3X12.25 (PERSONAL CARE ITEMS) ×2 IMPLANT
PENCIL SMOKE EVAC W/HOLSTER (ELECTROSURGICAL) ×2 IMPLANT
RTRCTR C-SECT PINK 25CM LRG (MISCELLANEOUS) ×2 IMPLANT
STRIP CLOSURE SKIN 1/2X4 (GAUZE/BANDAGES/DRESSINGS) ×2 IMPLANT
SUT MNCRL 0 VIOLET CTX 36 (SUTURE) ×2 IMPLANT
SUT MNCRL+ AB 3-0 CT1 36 (SUTURE) ×2 IMPLANT
SUT MONOCRYL 0 CTX 36 (SUTURE) ×2
SUT MONOCRYL AB 3-0 CT1 36IN (SUTURE) ×2
SUT PDS AB 0 CTX 36 PDP370T (SUTURE) ×4 IMPLANT
SUT PLAIN 0 NONE (SUTURE) IMPLANT
SUT VIC AB 2-0 CT1 27 (SUTURE)
SUT VIC AB 2-0 CT1 TAPERPNT 27 (SUTURE) IMPLANT
SUT VIC AB 4-0 KS 27 (SUTURE) ×2 IMPLANT
TOWEL OR 17X24 6PK STRL BLUE (TOWEL DISPOSABLE) ×4 IMPLANT
TRAY FOLEY W/BAG SLVR 14FR LF (SET/KITS/TRAYS/PACK) ×2 IMPLANT
WATER STERILE IRR 1000ML POUR (IV SOLUTION) ×2 IMPLANT

## 2020-08-07 NOTE — Progress Notes (Addendum)
Hypoglycemic Event  CBG: 63  Treatment: 4 oz juice/soda  Symptoms: None  Follow-up CBG: [id number] CBG Result:75  Possible Reasons for Event: Inadequate meal intake  Comments/MD notified:Dr. Andres Shad Anesthesia OK with juice while NPO    April Burke

## 2020-08-07 NOTE — Anesthesia Postprocedure Evaluation (Signed)
Anesthesia Post Note  Patient: Scientist, product/process development  Procedure(s) Performed: CESAREAN SECTION (N/A ) CERCLAGE CERVICAL removal (N/A )     Patient location during evaluation: PACU Anesthesia Type: Spinal Level of consciousness: oriented and awake and alert Pain management: pain level controlled Vital Signs Assessment: post-procedure vital signs reviewed and stable Respiratory status: spontaneous breathing, respiratory function stable and nonlabored ventilation Cardiovascular status: blood pressure returned to baseline and stable Postop Assessment: no headache, no backache, no apparent nausea or vomiting and spinal receding Anesthetic complications: no   No complications documented.  Last Vitals:  Vitals:   08/07/20 2045 08/07/20 2048  BP:  111/61  Pulse: 85 68  Resp: 19 (!) 25  Temp:    SpO2: 100% 100%    Last Pain:  Vitals:   08/07/20 2027  TempSrc:   PainSc: 4    Pain Goal:    LLE Motor Response: Purposeful movement (08/07/20 2030) LLE Sensation: Tingling (08/07/20 2030) RLE Motor Response: Purposeful movement (08/07/20 2030) RLE Sensation: Tingling (08/07/20 2030)     Epidural/Spinal Function Cutaneous sensation: Tingles (08/07/20 2030), Patient able to flex knees: Yes (08/07/20 2030), Patient able to lift hips off bed: Yes (08/07/20 2030), Back pain beyond tenderness at insertion site: No (08/07/20 2030), Progressively worsening motor and/or sensory loss: No (08/07/20 2030), Bowel and/or bladder incontinence post epidural: No (08/07/20 2030)  Lidia Collum

## 2020-08-07 NOTE — Transfer of Care (Signed)
Immediate Anesthesia Transfer of Care Note  Patient: April Burke  Procedure(s) Performed: CESAREAN SECTION (N/A ) CERCLAGE CERVICAL removal (N/A )  Patient Location: PACU  Anesthesia Type:Spinal  Level of Consciousness: awake, alert  and patient cooperative  Airway & Oxygen Therapy: Patient Spontanous Breathing  Post-op Assessment: Report given to RN and Post -op Vital signs reviewed and stable  Post vital signs: Reviewed and stable  Last Vitals:  Vitals Value Taken Time  BP 119/85 08/07/20 1933  Temp    Pulse 72 08/07/20 1936  Resp 18 08/07/20 1936  SpO2 100 % 08/07/20 1936  Vitals shown include unvalidated device data.  Last Pain:  Vitals:   08/07/20 1500  TempSrc:   PainSc: 0-No pain         Complications: No complications documented.

## 2020-08-07 NOTE — Anesthesia Preprocedure Evaluation (Addendum)
Anesthesia Evaluation  Patient identified by MRN, date of birth, ID band Patient awake    Reviewed: Allergy & Precautions, NPO status , Patient's Chart, lab work & pertinent test results  Airway Mallampati: II  TM Distance: >3 FB Neck ROM: Full    Dental   Pulmonary neg pulmonary ROS,    Pulmonary exam normal        Cardiovascular Exercise Tolerance: Good Normal cardiovascular exam     Neuro/Psych negative neurological ROS     GI/Hepatic negative GI ROS, Neg liver ROS,   Endo/Other  diabetes, Gestational  Renal/GU negative Renal ROS     Musculoskeletal negative musculoskeletal ROS (+)   Abdominal   Peds  Hematology  (+) anemia , Lab Results      Component                Value               Date                      WBC                      5.7                 08/07/2020                HGB                      9.9 (L)             08/07/2020                HCT                      32.2 (L)            08/07/2020                MCV                      83.4                08/07/2020                PLT                      215                 08/07/2020              Anesthesia Other Findings   Reproductive/Obstetrics (+) Pregnancy                            Anesthesia Physical Anesthesia Plan  ASA: III  Anesthesia Plan: Spinal   Post-op Pain Management:    Induction: Intravenous  PONV Risk Score and Plan: 3 and Treatment may vary due to age or medical condition, Ondansetron and Dexamethasone  Airway Management Planned: Natural Airway  Additional Equipment: None  Intra-op Plan:   Post-operative Plan:   Informed Consent: I have reviewed the patients History and Physical, chart, labs and discussed the procedure including the risks, benefits and alternatives for the proposed anesthesia with the patient or authorized representative who has indicated his/her understanding and  acceptance.     Dental advisory given  Plan Discussed with: CRNA and Anesthesiologist  Anesthesia Plan Comments: (37.2 wk G5P0 for c/s for breech)       Anesthesia Quick Evaluation

## 2020-08-07 NOTE — Op Note (Signed)
Pre Op Dx:   1.  Single live IUP at 108w2d 2.  4/8 BPP 3.  Fetal malpresentation (Breech) 4.  History of cervical insufficiency with current McDonald and Mersilene tape cerclage in place 5.  Class A2 Diabetes Mellitus Post Op Dx:  Same as pre-operative diagnoses Procedure:  Primary Low Transverse Cesarean Section and Removal of cerclages  Surgeon:  Dr. Drema Dallas Assistants:  Wilford Sports, CNM Anesthesia:  Spinal  EBL:  316cc  IVF:  2800cc UOP:  150cc  Drains:  Foley catheter  Specimen removed:  Placenta - sent to pathology  Umbilical arterial and venous gases  McDonald and Mersilene tape cerclages - removed and discarded Device(s) implanted:  None Case Type:  Clean-contaminated Findings: Normal-appearing uterus with few small sub-centimeter fibroids present, bilateral fallopian tubes, and ovaries.  Clear amniotic fluid.  Fetus in breech position. Complications: None Indications:  30 y.o. G4P0120 at [redacted]w[redacted]d with 4/8 BPP and fetus in breech position.  Procedure:  After informed consent was obtained, the patient was brought to the operating room.  Following administration of spinal anesthesia, the patient was positioned in dorsal supine position with a leftward tilt and was prepped and draped in sterile fashion.  A preoperative time-out was performed.  The abdomen was entered in layers through a pfannenstiel incision and a retractor was placed.  A low transverse hysterotomy was created sharply to the level of the membranes, then extended bluntly.  The fetus was delivered from breech presentation onto the field.  Bulb suctioning was performed.  The cord was doubly clamped and cut after a 60 second pause.  The newborn was passed to the warmer.  The placenta was delivered.  The uterus was swept free of clots and debris and closed in a running locked fashion with 0-Monocryl. A second imbricating layer was used to close the uterus using 0-Monocryl.  Hemostasis was verified.  The abdomen was  irrigated with warmed saline and cleared of clots.  The peritoneum was closed in a running fashion with 2-0 Vicryl.  Subfascial spaces were inspected and hemostasis assured.  The fascia was closed in a running fashion with 0-PDS.  The subcutaneous tissues were irrigated and hemostasis assured.  The subcutaneous tissues were closed with 3-0 Monocryl.  The skin was closed with 4-0 Vicryl.  A sterile bandage was applied.  Attention was then turned to removal of cerclages.  The vagina was prepped again in usual sterile fashion.  The cerclages were visualized.  Both cerclages were removed in their entirety sharply.  Silver nitrate was used at the site of the Mersilene tape cerclage site.  Adequate hemostasis was noted. The patient was transferred to PACU.  All needle, sponge, and instrument counts were correct at the end of the case.   Disposition:  PACU  I performed the procedure and the assistant was needed due to the complexity of the anatomy.  Drema Dallas, DO

## 2020-08-07 NOTE — Anesthesia Procedure Notes (Signed)
Spinal  Patient location during procedure: OB Start time: 08/07/2020 5:49 PM End time: 08/07/2020 5:54 PM Staffing Performed: anesthesiologist  Anesthesiologist: Barnet Glasgow, MD Preanesthetic Checklist Completed: patient identified, IV checked, risks and benefits discussed, surgical consent, monitors and equipment checked, pre-op evaluation and timeout performed Spinal Block Patient position: sitting Prep: DuraPrep and site prepped and draped Patient monitoring: heart rate, cardiac monitor, continuous pulse ox and blood pressure Approach: midline Location: L3-4 Injection technique: single-shot Needle Needle type: Pencan  Needle gauge: 24 G Needle length: 10 cm Needle insertion depth: 7 cm Assessment Sensory level: T4 Additional Notes  1 Attempt (s). Pt tolerated procedure well.

## 2020-08-07 NOTE — Lactation Note (Signed)
This note was copied from a baby's chart. Lactation Consultation Note  Patient Name: April Burke HXTAV'W Date: 08/07/2020 Reason for consult: Initial assessment;1st time breastfeeding;Early term 37-38.6wks;Maternal endocrine disorder (C/S delivery) Age:29 hours Per mom, infant latched in recovery for 15 minutes but not consistently. Infant had two voids, dad changed void diaper while LC was in the room. Mom latched infant for 15 minutes on her right breast using the football hold position, LC worked with mom feeling comfortable that infant can breathe while breastfeeding.  Mom hand expressed but colostrum not present at this time, mom wants to supplement infant with formula LEAD discussed, mom declined donor breast milk. After latching infant at the breast, dad supplemented infant with 8 mls of Enfamil with iron ( 20 kcal ) formula. Mom was pumping with DEBP and understands to pump every 3 hours for 15 minutes on initial setting.  Mom shown how to use DEBP & how to disassemble, clean, & reassemble parts. LC discussed input and output with parents. Mom made aware of O/P services, breastfeeding support groups, community resources, and our phone # for post-discharge questions.  Mom's plan: 1- Latch infant according to cues, 8 to 12= or more times within 24 hours, STS. 2- Afterwards infant will be supplemented with formula based on infant's age/ hours of life. 3- Mom will pump every 3 hours for 15 minutes on initial setting. 4- Mom knows to call RN or [place] if she needs assistance with latching infant at the breast.  Maternal Data Has patient been taught Hand Expression?: Yes Does the patient have breastfeeding experience prior to this delivery?: No  Feeding Mother's Current Feeding Choice: Breast Milk and Formula  LATCH Score Latch: Grasps breast easily, tongue down, lips flanged, rhythmical sucking.  Audible Swallowing: A few with stimulation  Type of Nipple: Everted at rest and  after stimulation  Comfort (Breast/Nipple): Soft / non-tender  Hold (Positioning): Assistance needed to correctly position infant at breast and maintain latch.  LATCH Score: 8   Lactation Tools Discussed/Used Tools: Pump Breast pump type: Double-Electric Breast Pump Pump Education: Setup, frequency, and cleaning;Milk Storage Reason for Pumping: Mom had GDM in pregnancy, little breast changes in pregnancy and colostrum not present at this time with hand expression. Pumping frequency: Mom will pump every 3 hours for 15 minutes on inital setting.  Interventions Interventions: Breast feeding basics reviewed;Assisted with latch;Skin to skin;Breast massage;Hand express;Breast compression;Adjust position;Support pillows;Position options;Expressed milk;DEBP;Education  Discharge Pump: DEBP  Consult Status Consult Status: Follow-up Date: 08/08/20 Follow-up type: In-patient    April Burke 08/07/2020, 11:03 PM

## 2020-08-07 NOTE — H&P (Addendum)
HPI: 30 y.o. G4P0120 @ 49w2destimated gestational age (as dated by 6 week ultrasound) presents for primary cesarean section 4/8 BPP and fetal malpresentation (breech). She will have her cerclages removed after CS.  Leakage of fluid:  No Vaginal bleeding:  No Contractions:  No Fetal movement:  Decreased  Prenatal care [facility]  ROS:  Denies fevers, chills, chest pain, visual changes, SOB, RUQ/epigastric pain, N/V, dysuria, hematuria, or sudden onset/worsening bilateral LE or facial edema.  Pregnancy complicated by: History of cervical insufficiency (17 and 20 weeks) with history of failed rescue cerclage - has current McDonald Cerclage in place (knot at 12 o'clock) and a Mersilene tape cerclage (knot at 1 o'clock) Class A2DM Obesity (BMI 33) IPV Rubella non-immune   Prenatal Transfer Tool  Maternal Diabetes: Yes:  Diabetes Type:  Insulin/Medication controlled Genetic Screening: Declined Maternal Ultrasounds/Referrals: Other: History of cervical insufficiency Fetal Ultrasounds or other Referrals:  Referred to Materal Fetal Medicine  Maternal Substance Abuse:  No Significant Maternal Medications:  Meds include: Other: Metformin 5042mBID, vaginal progesterone qHS Significant Maternal Lab Results: Group B Strep positive   Prenatal Labs Blood type:  B Positive Antibody screen:  Negative CBC:  H/H 10.4/30.6 Rubella: Immune RPR:  Non-reactive Hep B:  Negative Hep C:  Negative HIV:  Negative GC/CT:  Negative Glucola:  143  3h GTT abnormal  Immunizations: Tdap: Given prenatally Flu: Declined this pregnancy COVID:  Received  OBHx:  OB History    Gravida  5   Para  0   Term      Preterm  0   AB  4   Living  0     SAB  2   IAB  1   Ectopic      Multiple      Live Births           Obstetric Comments  Failed rescue cerclage       PMHx:  See above Meds:  PNV, Metformin 50041mID, Vaginal progesterone  qHS Allergy:  No Known Allergies SurgHx: Cerclage placement x 3 (one in previous pregnancy and two this pregnancy) SocHx:   Denies Tobacco, ETOH, illicit drugs  O: LMP 11/07/58/1093en. AAOx3, NAD CV.  RRR  Resp. CTAB, no wheezes/rales/rhonchi Abd. Gravid, soft, non-tender throughout, no rebound/guarding Extr.  no bilateral LE edema, no calf tenderness bilaterally SVE: deferred   Last [place]/2/22): BPP 4/8 (off for movement and tone), fetus breech   Last growth [place]/10/22):  EFW 2472g (50%), AAFV, breech, anterior placenta  Labs: see orders  A/P:  30 26o. [id number] presents for primary cesarean section for 4/8 BPP and fetal malpresentation (breech).  Primary CS (breech) - Admit to L&D - Admit labs (CBC, T&S, COVID screen) - CEFM/Toco - FWB:  4/8 BPP - Diet:  NPO - IVF:  LR at 125cc/hour - VTE Prophylaxis:  SCDs - GBS Status:  Positive - Presentation:  Breech - Consented for C/S, cerclage removal, and blood products  History of cervical insufficiency (17 and 20 weeks) with history of failed rescue cerclage - Has current McDonald Cerclage in place (knot at 12 o'clock) and a Mersilene tape cerclage (knot at 1 o'clock) - Cerclage removal after cesarean section  Class A2DM - Medication:  Metformin 500mg63m  Obesity (BMI 33) - Encourage ambulation postpartum  IPV - Last reported feeling safe  Rubella non-immune - MMR vaccination postpartum  Consents: We discussed risks/benefits/alternatives to ECV  vs primary CS for breech presentation.  Reviewed ECV risk of placental abruption, fetal heart tracing abnormalities, and decreased likelihood of success given anterior placenta.  We discussed ~50% success rate.  Counseled patient that after ECV would be performed that we would removal cerclage and induce labor.  Patient opts for primary cesarean section. I have explained to the patient that this surgery is performed to deliver their baby or babies through an incision in  the abdomen and incision in the uterus.  Prior to surgery, the risks and benefits of the surgery, as well as alternative treatments were discussed.  The risks include, but are not limited to, possible need for cesarean delivery for all future pregnancies, bleeding at the time of surgery that could necessitate a blood transfusion and/or hysterectomy, rupture of the uterus during a future pregnancy that could cause a preterm delivery and/or requiring hysterectomy, infection, damage to surrounding organs and tissues, damage to bladder, damage to ureters, causing kidney damage, and requiring additional procedures, damage to bowels, resulting in further surgery, postoperative pain, short-term and long-term, scarring on the abdominal wall and intra-abdominally, need for further surgery, development of an incisional hernia, deep vein thrombosis and/or pulmonary embolism, wound infection and/or separation, painful intercourse, urinary leakage, impact on future pregnancies including but not limited to, abnormal location or attachment of the placenta to the uterus, such as placenta previa or accreta, that may necessitate a blood transfusion and/or hysterectomy, impact on total family size, complications the course of which cannot be predicted or prevented, and death. Patient was consented for blood products.  The patient is aware that bleeding may result in the need for a blood transfusion which includes risk of transmission of HIV (1:2 million), Hepatitis C (1:2 million), and Hepatitis B (1:200 thousand) and transfusion reaction.  Patient voiced understanding of the above risks as well as understanding of indications for blood transfusion.   Drema Dallas, DO [phone] (office)

## 2020-08-08 ENCOUNTER — Encounter (HOSPITAL_COMMUNITY): Payer: Self-pay | Admitting: Obstetrics and Gynecology

## 2020-08-08 LAB — CBC
HCT: 27.5 % — ABNORMAL LOW (ref 36.0–46.0)
Hemoglobin: 8.6 g/dL — ABNORMAL LOW (ref 12.0–15.0)
MCH: 25.8 pg — ABNORMAL LOW (ref 26.0–34.0)
MCHC: 31.3 g/dL (ref 30.0–36.0)
MCV: 82.6 fL (ref 80.0–100.0)
Platelets: 208 10*3/uL (ref 150–400)
RBC: 3.33 MIL/uL — ABNORMAL LOW (ref 3.87–5.11)
RDW: 15.9 % — ABNORMAL HIGH (ref 11.5–15.5)
WBC: 10.1 10*3/uL (ref 4.0–10.5)
nRBC: 0 % (ref 0.0–0.2)

## 2020-08-08 LAB — RPR: RPR Ser Ql: NONREACTIVE

## 2020-08-08 MED ORDER — FERROUS SULFATE 325 (65 FE) MG PO TABS
325.0000 mg | ORAL_TABLET | Freq: Every day | ORAL | Status: DC
  Administered 2020-08-08 – 2020-08-10 (×3): 325 mg via ORAL
  Filled 2020-08-08 (×3): qty 1

## 2020-08-08 NOTE — Progress Notes (Signed)
Inpatient Diabetes Program Recommendations  AACE/ADA: New Consensus Statement on Inpatient Glycemic Control (2015)  Target Ranges:  Prepandial:   less than 140 mg/dL      Peak postprandial:   less than 180 mg/dL (1-2 hours)      Critically ill patients:  140 - 180 mg/dL   Lab Results  Component Value Date   GLUCAP 79 08/07/2020    Review of Glycemic Control Results for BLESSYN, [place] (MRN [id number]) as of 08/08/2020 07:53  Ref. Range 08/07/2020 16:22 08/07/2020 16:41 08/07/2020 19:43 08/07/2020 20:33  Glucose-Capillary Latest Ref Range: 70 - 99 mg/dL 63 (L) 75 64 (L) 79   Diabetes history: GDM Outpatient Diabetes medications: Metformin 500 mg BID Current orders for Inpatient glycemic control: none Decadron 4 mg x1  Inpatient Diabetes Program Recommendations:    Noted mild lows while intra-op, assuming related to NPO status. Consider obtaining FSBG while inpatient.   Thanks, Bronson Curb, MSN, RNC-OB Diabetes Coordinator [phone] (8a-5p)

## 2020-08-08 NOTE — Lactation Note (Signed)
This note was copied from a baby's chart. Lactation Consultation Note  Patient Name: April Burke Date: 08/08/2020 Reason for consult: Follow-up assessment Age:29 hours   P1 mother whose infant is now 23 hours old.  This is an ETI at 37+2 weeks.  Mother is breast feeding and supplementing with formula.  Baby swaddled and asleep in the bassinet when I arrived.  Reviewed newborn care and behavior with parents.  Discussed the "ETI" in reference to feeding and expectations.  Answered questions.  Asked parents to continue watching for cues and to feed at least every three hours dues to gestational age.  Asked mother to feed STS and to call her RN/LC for latch assistance as needed.  After breast feeding, father will supplement while mother pumps every feeding.  Mother concerned that she is not "getting anything" when she pumps.  Reviewed pumping basics and mother more comfortable. Suggested family use the purple extra slow flow nipples instead of the green nipples that are currently in her room.  Mother stated that baby tries to "go too fast" with the green nipple.   Praised parents for their efforts.  Continue to educate and support parents.    Mother has a DEBP for home use.     Maternal Data    Feeding    LATCH Score                    Lactation Tools Discussed/Used    Interventions Interventions: Education  Discharge    Consult Status Consult Status: Follow-up Date: 08/09/20 Follow-up type: In-patient    [name] 08/08/2020, 12:54 PM

## 2020-08-08 NOTE — Progress Notes (Signed)
Subjective: Postpartum Day 1: Cesarean Delivery Patient reports incisional pain and tolerating PO.    Objective: Vital signs in last 24 hours: Temp:  [98 F (36.7 C)-98.8 F (37.1 C)] 98.4 F (36.9 C) (03/03 0600) Pulse Rate:  [68-85] 78 (03/03 0600) Resp:  [16-28] 16 (03/03 0600) BP: (89-140)/(61-107) 118/70 (03/03 0600) SpO2:  [97 %-100 %] 98 % (03/03 0600) Weight:  [104.3 kg] 104.3 kg (03/02 0959)  Physical Exam:  General: alert, cooperative and no distress Lochia: appropriate Uterine Fundus: firm Incision: bandage clean dry and intact  DVT Evaluation: No evidence of DVT seen on physical exam.  Recent Labs    08/07/20 1023 08/08/20 0504  HGB 9.9* 8.6*  HCT 32.2* 27.5*    Assessment/Plan: Status post Cesarean section. Doing well postoperatively.  Routine post cesarean care  Encourage ambulation once foley removed  Pt may shower this evening.  Pt encouraged to remove outer bandage in the shower.  Pain currently controlled.  Anemia - ferrous sulfate daily .  Christophe Louis 08/08/2020, 7:55 AM

## 2020-08-09 ENCOUNTER — Encounter (HOSPITAL_COMMUNITY): Payer: Self-pay | Admitting: Obstetrics and Gynecology

## 2020-08-09 LAB — SURGICAL PATHOLOGY

## 2020-08-09 MED ORDER — MEASLES, MUMPS & RUBELLA VAC IJ SOLR: 0.5000 mL | Freq: Once | INTRAMUSCULAR | Status: DC

## 2020-08-09 MED ORDER — IBUPROFEN 800 MG PO TABS
800.0000 mg | ORAL_TABLET | Freq: Three times a day (TID) | ORAL | Status: DC
  Administered 2020-08-09 – 2020-08-10 (×3): 800 mg via ORAL
  Filled 2020-08-09 (×4): qty 1

## 2020-08-09 NOTE — Lactation Note (Signed)
This note was copied from a baby's chart. Lactation Consultation Note  Patient Name: Girl Maely Clements KWIOX'B Date: 08/09/2020   Age:29 hours   LC checked with RN, Butch Penny since Mom bottle feeding all day if she wanted help with pumping and bottle feeding EBM.  RN stated Mom decided to formula feed exclusively.   Maternal Data    Feeding Nipple Type: Extra Slow Flow  LATCH Score                    Lactation Tools Discussed/Used    Interventions    Discharge    Consult Status      Crystal  Nicholson-Springer 08/09/2020, 3:03 PM

## 2020-08-09 NOTE — Progress Notes (Addendum)
Postpartum Note Day #2  S:  Patient doing well.  Reports incisional pain.  Tolerating regular diet.   Ambulating and voiding without difficulty. Has passed flatus. Has only ambulated in the room. She is afraid to use Oxycodone for pain. Denies fevers, chills, chest pain, SOB, N/V, or worsening bilateral LE edema.  Lochia: Minimal Infant feeding:  Bottle Circumcision:  NA, female infant Contraception:  None   O: Temp:  [97.5 F (36.4 C)-98.7 F (37.1 C)] 98.7 F (37.1 C) (03/04 0600) Pulse Rate:  [61-67] 62 (03/04 0600) Resp:  [16-20] 16 (03/04 0600) BP: (107-121)/(53-76) 121/76 (03/04 0600) SpO2:  [100 %] 100 % (03/04 0600) Gen: NAD, pleasant and cooperative CV: RRR Resp: CTAB, no wheezes/rales/rhonchi Abdomen: soft, non-distended, appropriately tender throughout Uterus: firm, non-tender, below umbilicus Incision: honeycomb dressing in place with mild saturation and some shadowing Ext: no bilateral LE edema, no bilateral calf tenderness, SCDs on and working  Labs: Recent Labs    08/07/20 1023 08/08/20 0504  HGB 9.9* 8.6*    A/P: Patient is a 30 y.o. [id number] POD#1 s/p LTCS (4/8 BPP, breech)  S/p LTCS - Pain:  Oxycodone 5 or 26m q4h PRN, added Motrin 8050mq8h scheduled, encouraged use of pain medication - GU: UOP is adequate - GI: Tolerating regular diet - Activity: encouraged sitting up to chair and ambulation as tolerated - DVT Prophylaxis: SCDs in bed and encourage ambulation - Labs: stable as above  Class A2DM - Will perform HgbA1c or 2h GTT at 6 week PPV  Obesity (BMI 33) - Encourage ambulation postpartum  IPV - Last reported feeling safe  Rubella non-immune - MMR vaccination postpartum - ordered  Disposition:  D/C home tomorrow (POD#3).   MeDrema DallasDO [phone])

## 2020-08-10 MED ORDER — FERROUS SULFATE 325 (65 FE) MG PO TBEC: 325.0000 mg | DELAYED_RELEASE_TABLET | Freq: Two times a day (BID) | ORAL | 3 refills | Status: AC

## 2020-08-10 MED ORDER — DOCUSATE SODIUM 100 MG PO CAPS: 100.0000 mg | ORAL_CAPSULE | Freq: Two times a day (BID) | ORAL | 3 refills | Status: AC

## 2020-08-10 MED ORDER — OXYCODONE HCL 5 MG PO TABS: 5.0000 mg | ORAL_TABLET | ORAL | 0 refills | Status: AC | PRN

## 2020-08-10 MED ORDER — IBUPROFEN 800 MG PO TABS: 800.0000 mg | ORAL_TABLET | Freq: Three times a day (TID) | ORAL | 0 refills | Status: AC

## 2020-08-10 MED ORDER — MEASLES, MUMPS & RUBELLA VAC IJ SOLR
0.5000 mL | Freq: Once | INTRAMUSCULAR | Status: AC
  Administered 2020-08-10: 0.5 mL via SUBCUTANEOUS

## 2020-08-10 NOTE — Discharge Summary (Signed)
Postpartum Discharge Summary  08/10/20     Patient Name: April Burke DOB: 1991-05-09 MRN: [id number]  Date of admission: 08/07/2020 Delivery date:08/07/2020  Delivering provider: Drema Dallas  Date of discharge: 08/10/2020  Admitting diagnosis: Breech presentation [O32.1XX0] Intrauterine pregnancy: [redacted]w[redacted]d    Secondary diagnosis:  Active Problems:   Breech presentation  Additional problems: History of cervical insufficiency with two cerclages this pregnancy, Class A2 Diabetes Mellitus, Obesity (BMI 33), RNI   Discharge diagnosis: Term Pregnancy Delivered                                              Post partum procedures:None Augmentation: N/A Complications: None  Hospital course: Sceduled C/S   30y.o. yo [id number] 383w2das admitted to the hospital 08/07/2020 for scheduled cesarean section with the following indication:Malpresentation and 4/8 BPP.Delivery details are as follows:  Membrane Rupture Time/Date: 6:13 PM ,08/07/2020   Delivery Method:C-Section, Low Transverse  She also had removal of both cerclages. Details of operation can be found in separate operative note.  Patient had an uncomplicated postpartum course.  She is ambulating, tolerating a regular diet, passing flatus, and urinating well. Patient is discharged home in stable condition on  08/10/20        Newborn Data: Birth date:08/07/2020  Birth time:6:13 PM  Gender:Female  Living status:Living  Apgars:8 ,9  Weight:3045 g     Magnesium Sulfate received: No BMZ received: No Rhophylac:N/A MMR: Ordered prior to discharge T-DaP:Given prenatally Flu: Previously declined Transfusion:No  Physical exam  Vitals:   08/09/20 0600 08/09/20 1409 08/09/20 2129 08/10/20 0500  BP: 121/76 125/79 112/64 127/75  Pulse: 62 (!) 57 72 66  Resp: '16 18 16 18  ' Temp: 98.7 F (37.1 C) 98.4 F (36.9 C) 98.2 F (36.8 C) 98 F (36.7 C)  TempSrc: Oral Oral Oral Oral  SpO2: 100% 100% 100% 99%  Weight:      Height:       General:  alert, cooperative and no distress  Cardio:  RRR Lungs:  CTAB, no wheezes/rales/rhonchi Lochia: appropriate Uterine Fundus: firm Incision: Dressing is clean, dry, and intact DVT Evaluation: No evidence of DVT seen on physical exam. No cords or calf tenderness. Labs: Lab Results  Component Value Date   WBC 10.1 08/08/2020   HGB 8.6 (L) 08/08/2020   HCT 27.5 (L) 08/08/2020   MCV 82.6 08/08/2020   PLT 208 08/08/2020   CMP Latest Ref Rng & Units 12/31/2019  Glucose 70 - 99 mg/dL 113(H)  BUN 6 - 20 mg/dL 7  Creatinine 0.44 - 1.00 mg/dL 0.71  Sodium 135 - 145 mmol/L 135  Potassium 3.5 - 5.1 mmol/L 3.2(L)  Chloride 98 - 111 mmol/L 102  CO2 22 - 32 mmol/L 22  Calcium 8.9 - 10.3 mg/dL 9.1  Total Protein 6.5 - 8.1 g/dL 8.2(H)  Total Bilirubin 0.3 - 1.2 mg/dL 0.4  Alkaline Phos 38 - 126 U/L 59  AST 15 - 41 U/L 29  ALT 0 - 44 U/L 15   Edinburgh Score: Edinburgh Postnatal Depression Scale Screening Tool 08/09/2020  I have been able to laugh and see the funny side of things. 1  I have looked forward with enjoyment to things. 0  I have blamed myself unnecessarily when things went wrong. 1  I have been anxious or worried for no good reason. 2  I have felt  scared or panicky for no good reason. 0  Things have been getting on top of me. 1  I have been so unhappy that I have had difficulty sleeping. 0  I have felt sad or miserable. 0  I have been so unhappy that I have been crying. 0  The thought of harming myself has occurred to me. 0  Edinburgh Postnatal Depression Scale Total 5      After visit meds:  Allergies as of 08/10/2020   No Known Allergies     Medication List    STOP taking these medications   aspirin EC 81 MG tablet   metFORMIN 500 MG tablet Commonly known as: GLUCOPHAGE   progesterone 200 MG capsule Commonly known as: Prometrium     TAKE these medications   docusate sodium 100 MG capsule Commonly known as: Colace Take 1 capsule (100 mg total) by mouth 2 (two)  times daily.   ferrous sulfate 325 (65 FE) MG EC tablet Take 1 tablet (325 mg total) by mouth 2 (two) times daily with a meal.   ibuprofen 800 MG tablet Commonly known as: ADVIL Take 1 tablet (800 mg total) by mouth every 8 (eight) hours.   nystatin cream Commonly known as: MYCOSTATIN Apply 1 application topically 2 (two) times daily as needed.   oxyCODONE 5 MG immediate release tablet Commonly known as: Oxy IR/ROXICODONE Take 1-2 tablets (5-10 mg total) by mouth every 4 (four) hours as needed for moderate pain.   prenatal multivitamin Tabs tablet Take 1 tablet by mouth daily at 12 noon.        Discharge home in stable condition Infant Feeding: Bottle Infant Disposition:home with mother Discharge instruction: per After Visit Summary and Postpartum booklet. Activity: Advance as tolerated. Pelvic rest for 6 weeks.  Diet: routine diet Anticipated Birth Control: None at this time Postpartum Appointment:6 weeks Additional Postpartum F/U: Incision check 2 weeks Future Appointments:No future appointments. Follow up Visit:   08/10/2020 Drema Dallas, DO

## 2020-10-03 DIAGNOSIS — U071 COVID-19: Secondary | ICD-10-CM | POA: Diagnosis not present

## 2020-10-13 DIAGNOSIS — U071 COVID-19: Secondary | ICD-10-CM | POA: Diagnosis not present

## 2021-01-17 DIAGNOSIS — N926 Irregular menstruation, unspecified: Secondary | ICD-10-CM | POA: Diagnosis not present

## 2021-01-17 DIAGNOSIS — R7301 Impaired fasting glucose: Secondary | ICD-10-CM | POA: Diagnosis not present

## 2021-01-17 DIAGNOSIS — Z3009 Encounter for other general counseling and advice on contraception: Secondary | ICD-10-CM | POA: Diagnosis not present

## 2022-03-15 IMAGING — US US MFM OB DETAIL+14 WK
1 series · 12 of 28 positions shown · non-contrast
Comparison: none

[Series 1: us mfm ob detail+14 wk · 56 acquisitions, 12 frames shown]
[im 3/56]
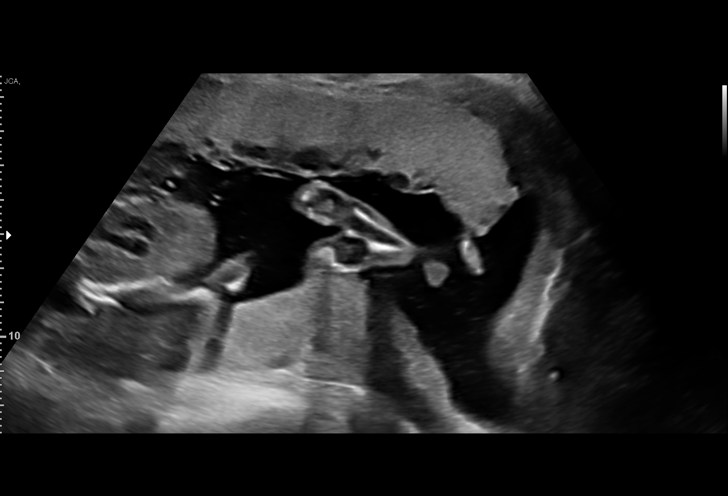
[im 7/56]
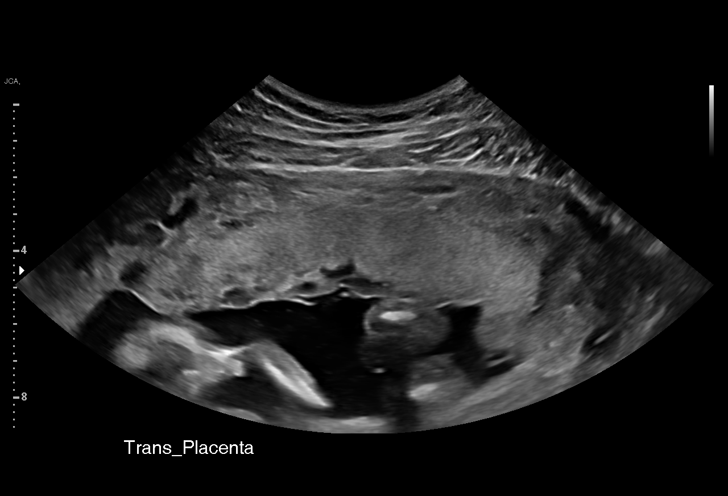
[im 11/56]
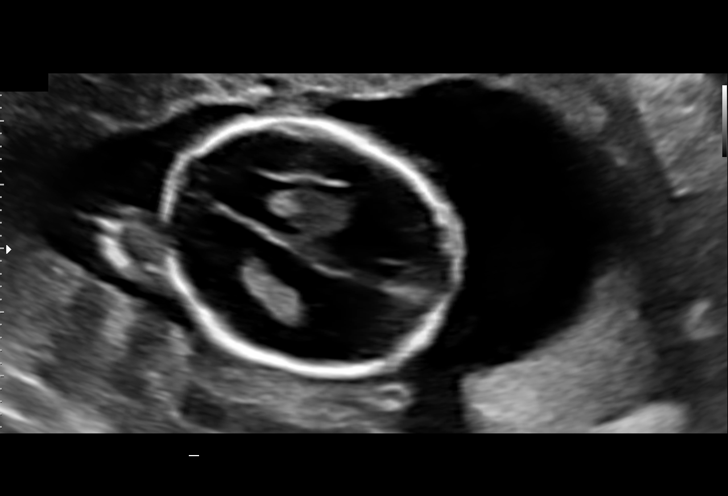
[im 17/56]
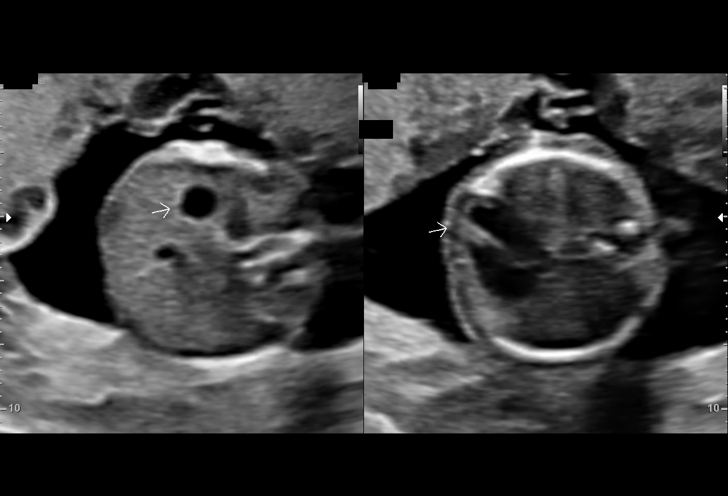
[im 21/56]
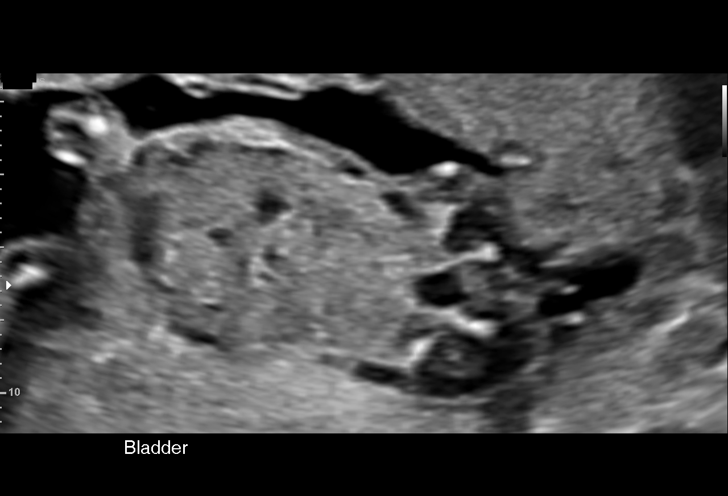
[im 25/56]
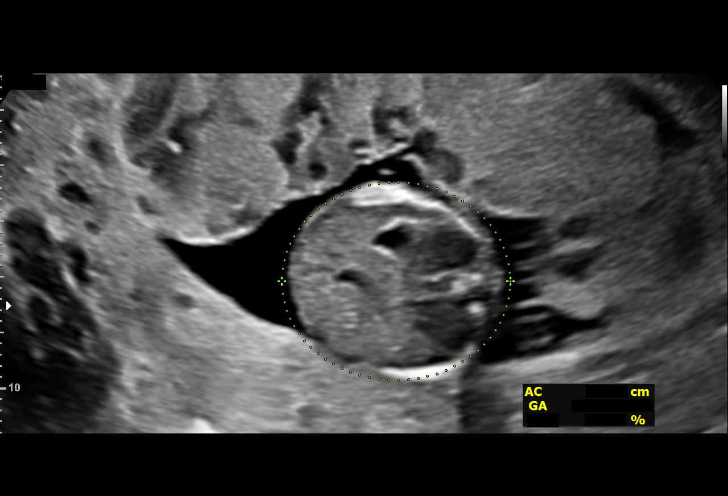
[im 31/56]
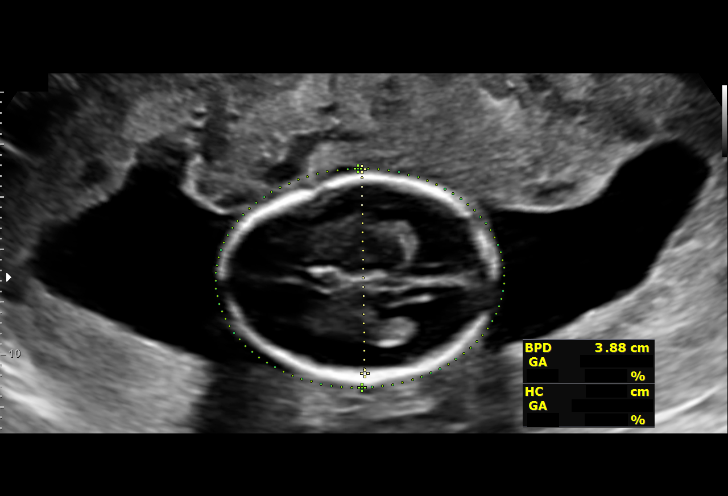
[im 35/56]
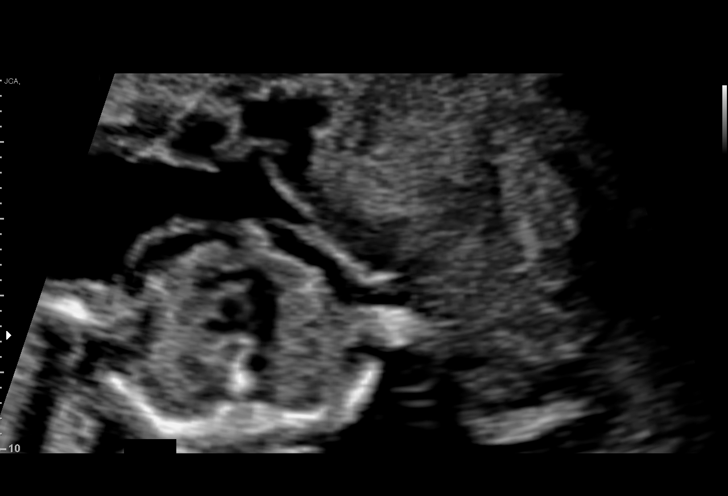
[im 39/56]
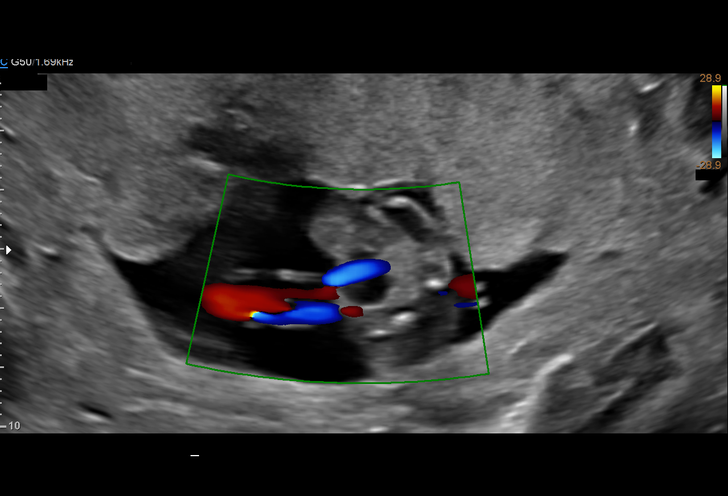
[im 45/56]
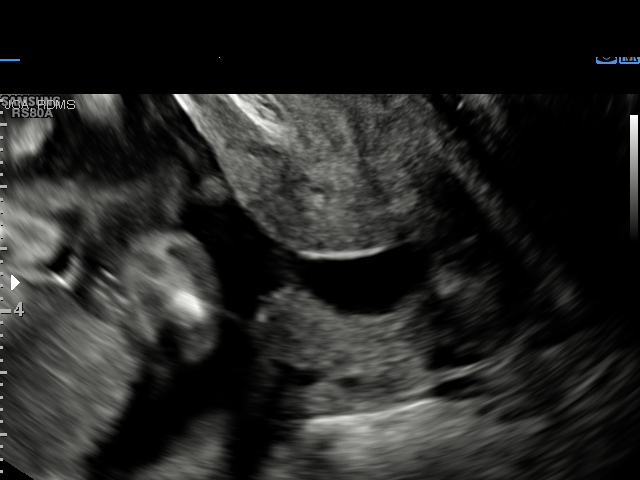
[im 49/56]
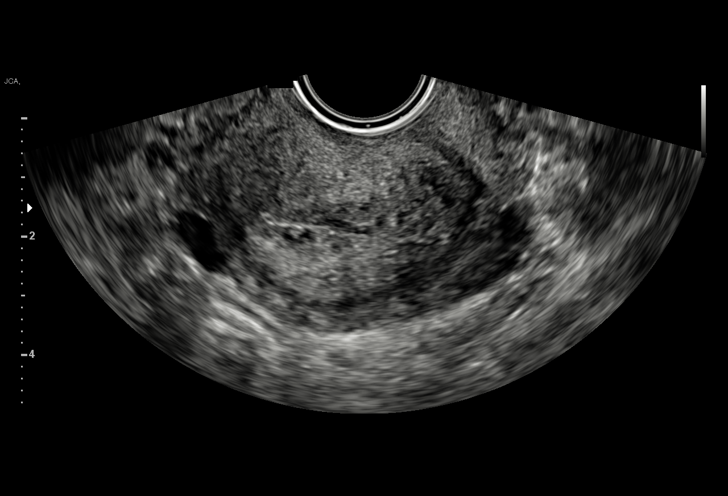
[im 53/56]
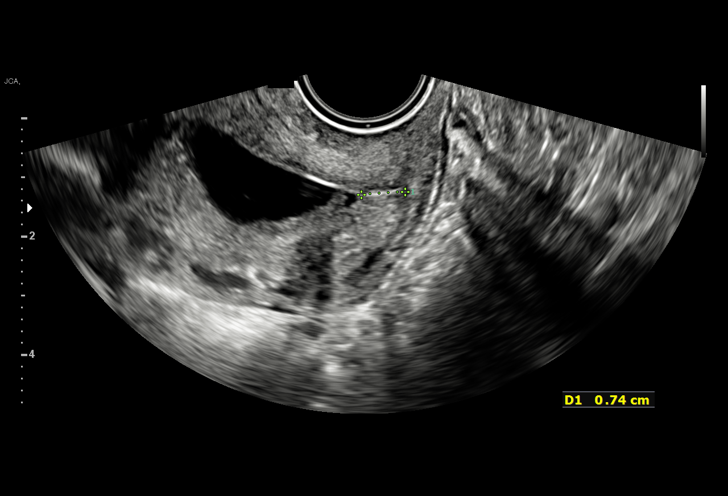

[12 of 28 positions shown; findings below may reference images not displayed]

[REDACTED]
                                                            Ave[address] [HOSPITAL]

Indications

 Cervical cerclage suture present, second
 trimester (Placed 02/26/20)
 Cervical incompetence, second trimester
 Poor obstetric history: Previous midtrimester
 loss (17 wks, 20 wks)
 Encounter for cervical length
 18 weeks gestation of pregnancy
 Obesity complicating pregnancy, second
 trimester
Fetal Evaluation

 Num Of Fetuses:         1
 Fetal Heart Rate(bpm):  151
 Cardiac Activity:       Observed
 Presentation:           Breech
 Placenta:               Anterior
 P. Cord Insertion:      Visualized

 Amniotic Fluid
 AFI FV:      Within normal limits

                             Largest Pocket(cm)

Biometry

 BPD:      38.8  mm     G. Age:  17w 6d         35  %    CI:         67.7   %    70 - 86
                                                         FL/HC:      18.4   %    15.8 - 18
 HC:      150.9  mm     G. Age:  18w 1d         42  %    HC/AC:      1.29        1.07 -
 AC:      116.8  mm     G. Age:  17w 3d         24  %    FL/BPD:     71.4   %
 FL:       27.7  mm     G. Age:  18w 3d         57  %    FL/AC:      23.7   %    20 - 24
 HUM:      30.3  mm     G. Age:  20w 0d       > 95  %
 CER:      19.2  mm     G. Age:  18w 5d         81  %
 LV:        6.9  mm
 CM:        4.1  mm

 Est. FW:     217  gm      0 lb 8 oz     33  %
OB History

 Gravidity:    5         Term:   0        Prem:   0        SAB:   2
 TOP:          2       Ectopic:  0        Living: 0
Gestational Age

 LMP:           19w 1d        Date:  11/13/19                 EDD:   08/19/20
 U/S Today:     18w 0d                                        EDD:   08/27/20
 Best:          18w 1d     Det. By:  Previous Ultrasound      EDD:   08/26/20
Anatomy

 Cranium:               Appears normal         LVOT:                   Not well visualized
 Cavum:                 Appears normal         Aortic Arch:            Not well visualized
 Ventricles:            Appears normal         Ductal Arch:            Appears normal
 Choroid Plexus:        Appears normal         Diaphragm:              Appears normal
 Cerebellum:            Appears normal         Stomach:                Appears normal, left
                                                                       sided
 Posterior Fossa:       Appears normal         Abdomen:                Appears normal
 Nuchal Fold:           Not well visualized    Abdominal Wall:         Appears nml (cord
                                                                       insert, abd wall)
 Face:                  Not well visualized    Cord Vessels:           Appears normal (3
                                                                       vessel cord)
 Lips:                  Not well visualized    Kidneys:                Not well visualized
 Palate:                Not well visualized    Bladder:                Appears normal
 Thoracic:              Appears normal         Spine:                  Not well visualized
 Heart:                 Not well visualized    Upper Extremities:      Appears normal
 RVOT:                  Not well visualized    Lower Extremities:      Appears normal

 Other:  Fetus appears to be female.
Impression

 Patient returned for fetal anatomy scan and cervical length
 measurement. She had prophylactic cerclage placed at 15-
 weeks' gestation. She does not have symptoms of pelvic
 pressure or vaginal bleeding.
 On ultrasound performed on 03/15/20, the cervix measured
 1.1 cm and the stitch was distal to the funneling.
 On cell-free fetal DNA screening, the results were
 inconclusive. Patient opted not to repeat screening.
 We performed a fetal anatomy scan. No markers of
 aneuploidies or fetal structural defects are seen. Fetal
 biometry is consistent with her previously-established dates.
 Amniotic fluid is normal and good fetal activity is seen.
 Patient understands the limitations of ultrasound in detecting
 fetal anomalies.

 Maternal obesity imposes limitations on the resolution of
 images, and failure to detect fetal anomalies is more common
 in obese pregnant women. As maternal obesity makes
 clinical assessment of fetal growth difficult, we recommend
 serial growth scans until delivery.

 We performed transvaginal ultrasound to evaluate the cervix.
 Funneling is seen beyond the level of cerclage. The residual-
 closed portion of cervix measures 7 millimeters.
 After explaining, I performed sterile-speculum examination.
 The external os is closed and the proline stitch was seen. On
 digital examination, the cervix is about 1.5 cm long and the
 external os is closed.
 I counseled the couple on the findings with help of ultrasound
 images and diagrams. Prolapse of the membrane distal to
 the level of cerclage carries a higher likelihood of miscarriage
 or preterm delivery. I discussed the options: 1) expectant
 management or 2) revision cerclage. I explained revision
 cerclage procedure in which an attempt will be made to place
 another cerclage higher and proximal to the existing cerclage.
 Revision cerclage does not guarantee carrying pregnancy to
 term.
 I explained the procedure and possible complications
 including miscarriage or rupture of membranes. I also
 informed the patient that even though expectant management
 is associated with a higher likelihood of miscarriage/preterm
 delivery, term or late preterm delivery is not unlikely.
 After counseling, the patient opted to have revision cerclage.

 Later, I discussed with Dr. Creek. Rescue cerclage is
 scheduled to be performed on 03/28/20.
Recommendations

 -Cervical length measurement in 2 weeks.
                 Torrence, Viana

## 2022-04-12 IMAGING — US US MFM OB FOLLOW-UP
1 series · 13 of 28 positions shown · non-contrast
Comparison: none

[Series 1: us mfm ob follow-up · 47 acquisitions, 13 frames shown]
[im 2/47]
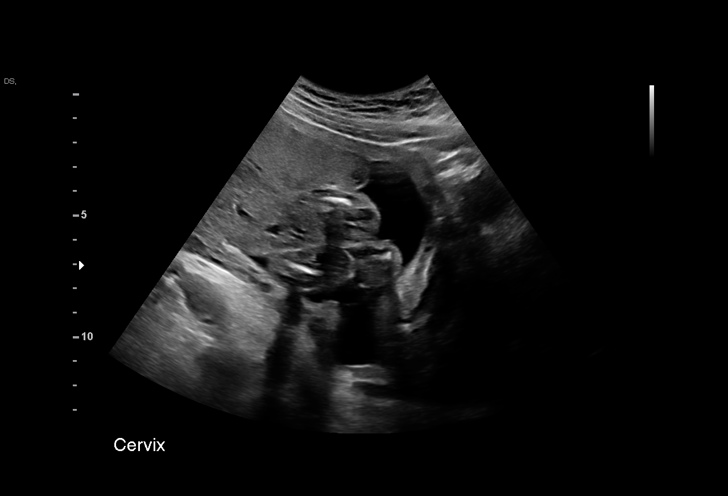
[im 6/47]
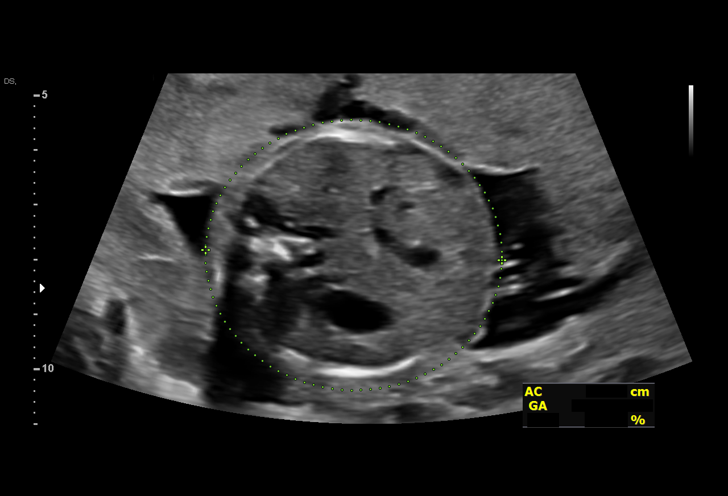
[im 9/47]
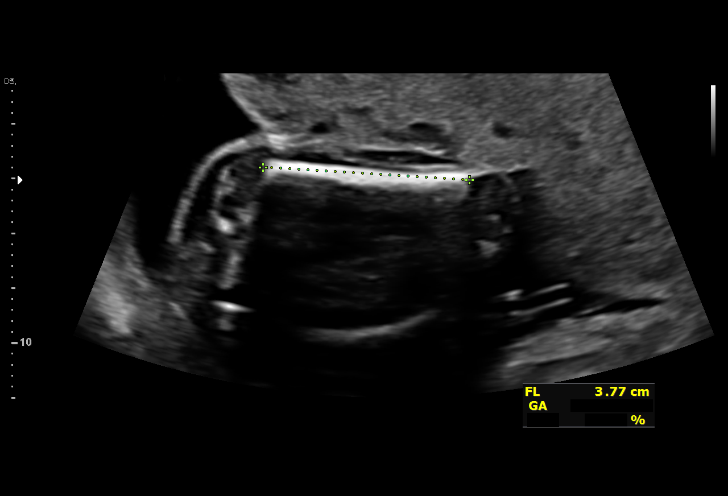
[im 12/47]
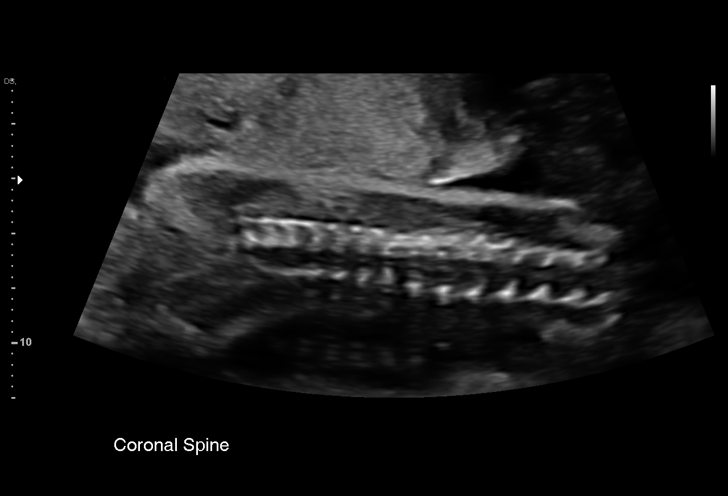
[im 16/47]
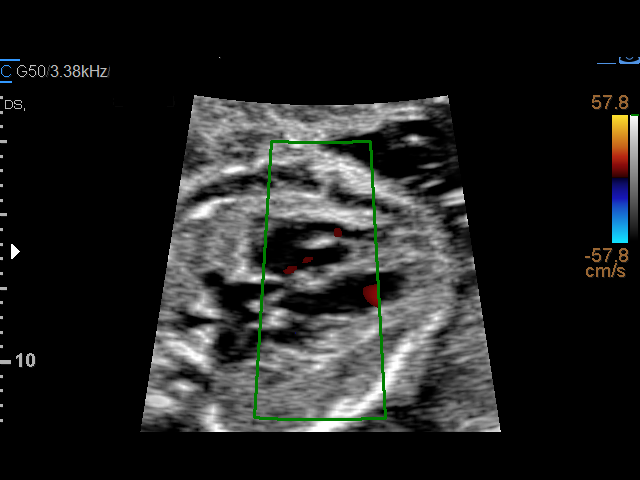
[im 19/47]
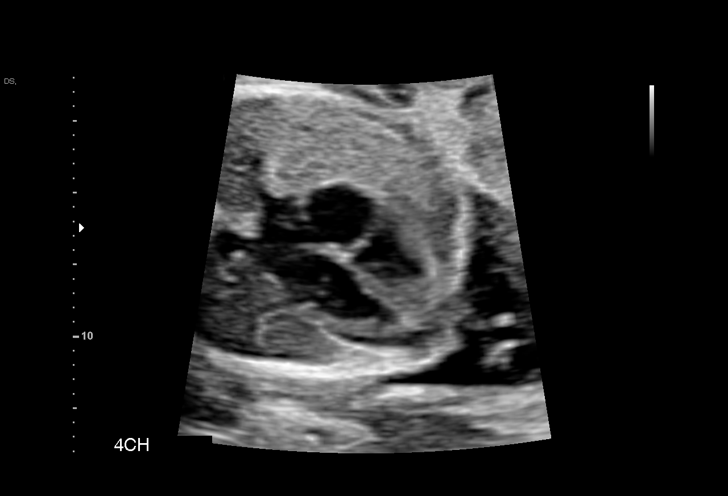
[im 24/47]
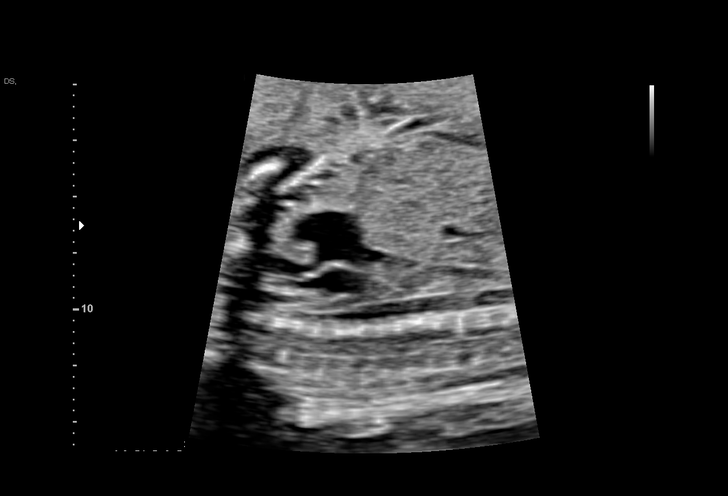
[im 28/47]
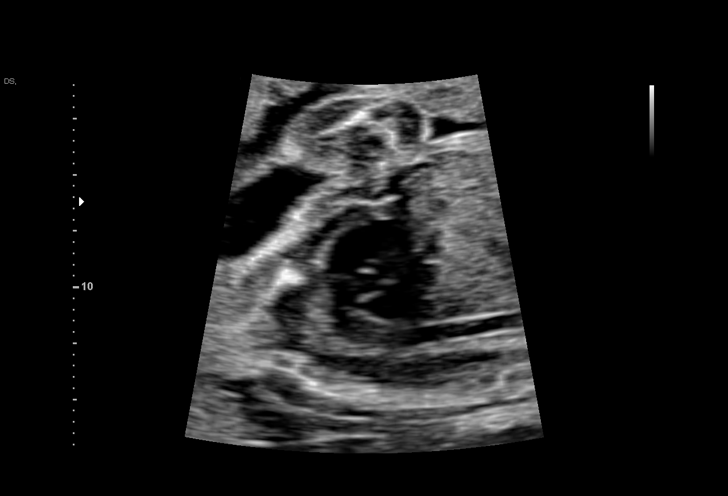
[im 31/47]
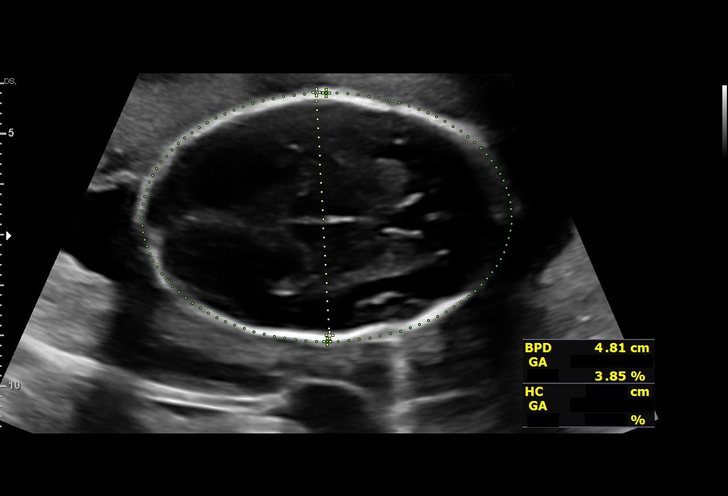
[im 35/47]
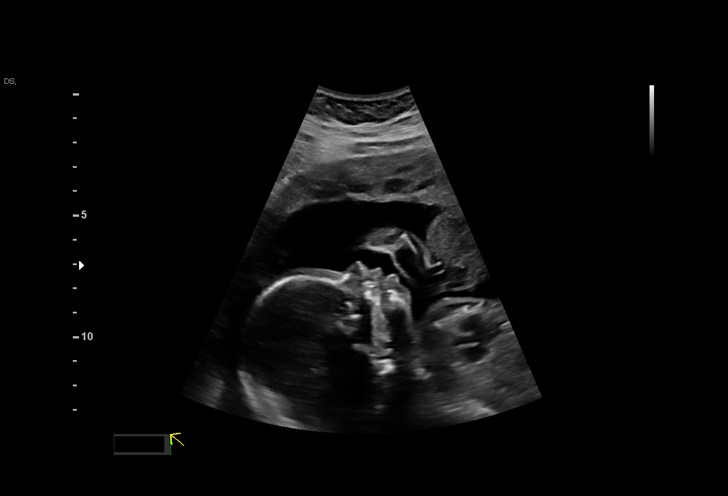
[im 38/47]
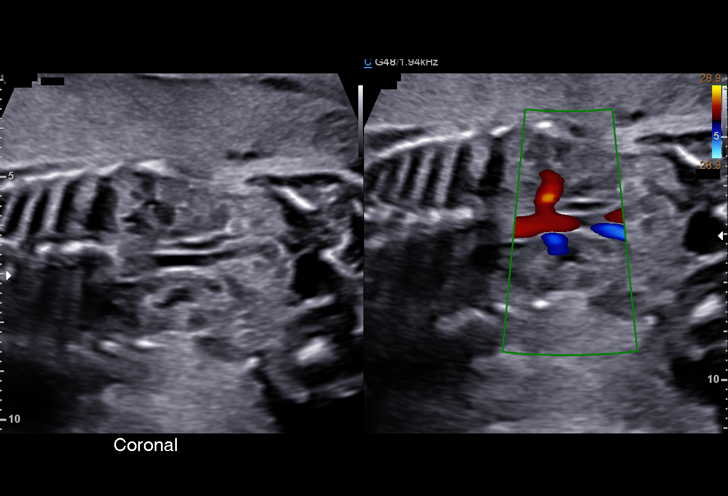
[im 41/47]
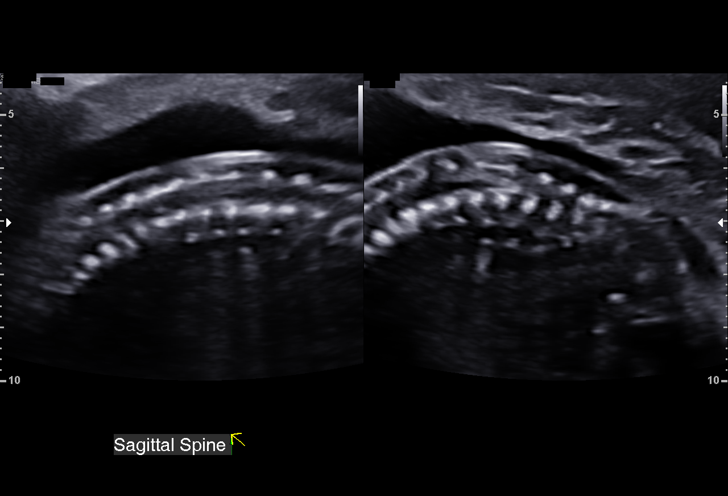
[im 45/47]
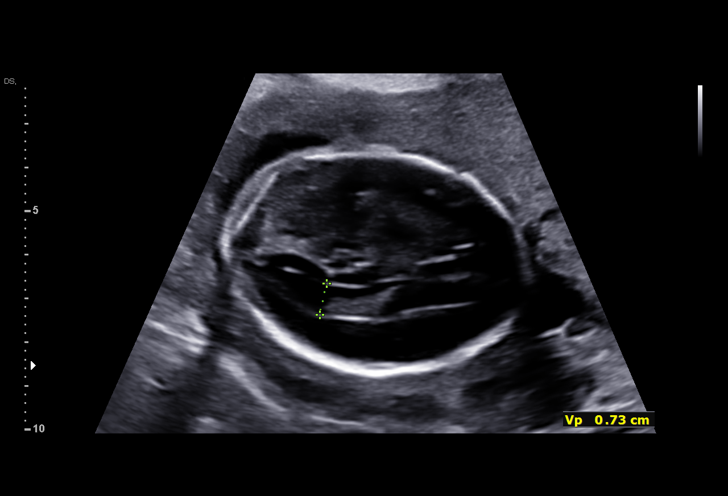

[13 of 28 positions shown; findings below may reference images not displayed]

[REDACTED]
                                                            Ave[address] [HOSPITAL]

Indications

 Cervical cerclage suture present, second
 trimester (Placed 02/26/20)
 Cervical incompetence, second trimester
 Obesity complicating pregnancy, second
 trimester
 Poor obstetric history: Previous midtrimester
 loss (17 wks, 20 wks)
 Encounter for cervical length
 22 weeks gestation of pregnancy
Fetal Evaluation

 Num Of Fetuses:         1
 Cardiac Activity:       Observed
 Presentation:           Variable
 Placenta:               Anterior
 P. Cord Insertion:      Visualized, central

 Amniotic Fluid
 AFI FV:      Within normal limits

                             Largest Pocket(cm)

Biometry
 BPD:      48.1  mm     G. Age:  20w 4d        3.9  %    CI:        65.11   %    70 - 86
                                                         FL/HC:      19.6   %    18.4 -
 HC:      191.5  mm     G. Age:  21w 3d         13  %    HC/AC:      1.18        1.06 -
 AC:      161.7  mm     G. Age:  21w 2d         17  %    FL/BPD:     78.2   %    71 - 87
 FL:       37.6  mm     G. Age:  22w 0d         35  %    FL/AC:      23.3   %    20 - 24
 CER:      22.3  mm     G. Age:  20w 6d         31  %
 CM:        5.7  mm

 Est. FW:     431  gm    0 lb 15 oz      17  %
OB History

 Gravidity:    5         Term:   0        Prem:   0        SAB:   2
 TOP:          2       Ectopic:  0        Living: 0
Gestational Age

 LMP:           23w 1d        Date:  11/13/19                 EDD:   08/19/20
 U/S Today:     21w 2d                                        EDD:   09/01/20
 Best:          22w 1d     Det. By:  Previous Ultrasound      EDD:   08/26/20
Anatomy

 Cranium:               Appears normal         RVOT:                   Appears normal
 Cavum:                 Appears normal         LVOT:                   Appears normal
 Ventricles:            Appears normal         Aortic Arch:            Appears normal
 Choroid Plexus:        Appears normal         Ductal Arch:            Appears normal
 Cerebellum:            Appears normal         Stomach:                Appears normal, left
                                                                       sided
 Posterior Fossa:       Appears normal         Kidneys:                Appear normal
 Face:                  Appears normal         Bladder:                Appears normal
                        (orbits and profile)
 Lips:                  Appears normal         Spine:                  Appears normal
 Palate:                Appears normal         Lower Extremities:      Appears normal
 Heart:                 Appears normal
                        (4CH, axis, and
                        situs)

 Other:  Fetus appears to be female. Nasal bone visualized. Feet visualized.
         VC, 3VV and 3VTV visualized. Other anatomy previously imaged and
         appeared normal.
Cervix Uterus Adnexa

 Cervix
 Funneling with cerclage and revision cerclage seen
Impression

 Patient returned for completion of fetal anatomy.  She had
 prophylactic cerclage and then revision cerclage in this
 pregnancy.  She does not have symptoms of uterine
 contractions or vaginal bleeding.

 Fetal biometry is consistent with her previously-established
 dates .Amniotic fluid is normal and good fetal activity is seen
 .Fetal anatomical survey was completed and appears normal.

 On transabdominal scan funneling is seen in the proximal
 cervix.  The cervical canal is not dilated.  The cerclage is
 difficult to visualize on transabdominal scan.  Because of
 absence of symptoms, we did not perform transvaginal
 ultrasound.

 miscarriage/preterm delivery.
Recommendations

 -An appointment was made for her to return in 4 weeks for
 fetal growth assessment.
                 Maritano, Aguustiin
